# Patient Record
Sex: Female | Born: 1990 | Race: Black or African American | Hispanic: No | Marital: Single | Smoking: Current some day smoker
Health system: Southern US, Community
[De-identification: ages and names within clinical notes are randomized; demographics above are authoritative.]

## PROBLEM LIST (undated history)

## (undated) DIAGNOSIS — J45909 Unspecified asthma, uncomplicated: Secondary | ICD-10-CM

## (undated) DIAGNOSIS — E119 Type 2 diabetes mellitus without complications: Secondary | ICD-10-CM

## (undated) DIAGNOSIS — E282 Polycystic ovarian syndrome: Secondary | ICD-10-CM

## (undated) DIAGNOSIS — I10 Essential (primary) hypertension: Secondary | ICD-10-CM

## (undated) HISTORY — PX: CHOLECYSTECTOMY: SHX55

---

## 2014-02-03 ENCOUNTER — Encounter (HOSPITAL_COMMUNITY): Payer: Self-pay | Admitting: Emergency Medicine

## 2014-02-03 ENCOUNTER — Emergency Department (HOSPITAL_COMMUNITY)
Admission: EM | Admit: 2014-02-03 | Discharge: 2014-02-03 | Disposition: A | Discharge status: Home / Self Care | Payer: Medicaid - Out of State | Attending: Emergency Medicine | Admitting: Emergency Medicine

## 2014-02-03 DIAGNOSIS — R51 Headache: Secondary | ICD-10-CM | POA: Diagnosis not present

## 2014-02-03 DIAGNOSIS — Z3202 Encounter for pregnancy test, result negative: Secondary | ICD-10-CM | POA: Diagnosis not present

## 2014-02-03 DIAGNOSIS — R109 Unspecified abdominal pain: Secondary | ICD-10-CM | POA: Diagnosis not present

## 2014-02-03 DIAGNOSIS — R112 Nausea with vomiting, unspecified: Secondary | ICD-10-CM | POA: Insufficient documentation

## 2014-02-03 DIAGNOSIS — R197 Diarrhea, unspecified: Secondary | ICD-10-CM | POA: Insufficient documentation

## 2014-02-03 DIAGNOSIS — I10 Essential (primary) hypertension: Secondary | ICD-10-CM | POA: Insufficient documentation

## 2014-02-03 DIAGNOSIS — J45909 Unspecified asthma, uncomplicated: Secondary | ICD-10-CM | POA: Diagnosis not present

## 2014-02-03 DIAGNOSIS — E119 Type 2 diabetes mellitus without complications: Secondary | ICD-10-CM | POA: Insufficient documentation

## 2014-02-03 DIAGNOSIS — Z9089 Acquired absence of other organs: Secondary | ICD-10-CM | POA: Insufficient documentation

## 2014-02-03 DIAGNOSIS — R1110 Vomiting, unspecified: Secondary | ICD-10-CM

## 2014-02-03 HISTORY — DX: Unspecified asthma, uncomplicated: J45.909

## 2014-02-03 HISTORY — DX: Essential (primary) hypertension: I10

## 2014-02-03 HISTORY — DX: Type 2 diabetes mellitus without complications: E11.9

## 2014-02-03 LAB — COMPREHENSIVE METABOLIC PANEL
ALT: 15 U/L (ref 0–35)
AST: 17 U/L (ref 0–37)
Albumin: 3.7 g/dL (ref 3.5–5.2)
Alkaline Phosphatase: 123 U/L — ABNORMAL HIGH (ref 39–117)
Anion gap: 11 (ref 5–15)
BUN: 12 mg/dL (ref 6–23)
CHLORIDE: 101 meq/L (ref 96–112)
CO2: 26 meq/L (ref 19–32)
CREATININE: 0.55 mg/dL (ref 0.50–1.10)
Calcium: 9.4 mg/dL (ref 8.4–10.5)
GLUCOSE: 134 mg/dL — AB (ref 70–99)
Potassium: 3.8 mEq/L (ref 3.7–5.3)
Sodium: 138 mEq/L (ref 137–147)
Total Protein: 7.3 g/dL (ref 6.0–8.3)

## 2014-02-03 LAB — URINALYSIS, ROUTINE W REFLEX MICROSCOPIC
BILIRUBIN URINE: NEGATIVE
GLUCOSE, UA: NEGATIVE mg/dL
HGB URINE DIPSTICK: NEGATIVE
KETONES UR: NEGATIVE mg/dL
Leukocytes, UA: NEGATIVE
Nitrite: NEGATIVE
Protein, ur: NEGATIVE mg/dL
Specific Gravity, Urine: 1.026 (ref 1.005–1.030)
Urobilinogen, UA: 1 mg/dL (ref 0.0–1.0)
pH: 5.5 (ref 5.0–8.0)

## 2014-02-03 LAB — CBC WITH DIFFERENTIAL/PLATELET
BASOS ABS: 0 10*3/uL (ref 0.0–0.1)
Basophils Relative: 1 % (ref 0–1)
Eosinophils Absolute: 0.1 10*3/uL (ref 0.0–0.7)
Eosinophils Relative: 3 % (ref 0–5)
HCT: 37.3 % (ref 36.0–46.0)
Hemoglobin: 12.2 g/dL (ref 12.0–15.0)
LYMPHS ABS: 2.6 10*3/uL (ref 0.7–4.0)
LYMPHS PCT: 49 % — AB (ref 12–46)
MCH: 26.6 pg (ref 26.0–34.0)
MCHC: 32.7 g/dL (ref 30.0–36.0)
MCV: 81.4 fL (ref 78.0–100.0)
MONO ABS: 0.4 10*3/uL (ref 0.1–1.0)
Monocytes Relative: 8 % (ref 3–12)
NEUTROS ABS: 2 10*3/uL (ref 1.7–7.7)
Neutrophils Relative %: 39 % — ABNORMAL LOW (ref 43–77)
Platelets: 395 10*3/uL (ref 150–400)
RBC: 4.58 MIL/uL (ref 3.87–5.11)
RDW: 13.4 % (ref 11.5–15.5)
WBC: 5.2 10*3/uL (ref 4.0–10.5)

## 2014-02-03 LAB — PREGNANCY, URINE: Preg Test, Ur: NEGATIVE

## 2014-02-03 MED ORDER — LOPERAMIDE HCL 2 MG PO CAPS
4.0000 mg | ORAL_CAPSULE | Freq: Once | ORAL | Status: AC
  Administered 2014-02-03: 4 mg via ORAL
  Filled 2014-02-03: qty 2

## 2014-02-03 MED ORDER — KETOROLAC TROMETHAMINE 30 MG/ML IJ SOLN
30.0000 mg | Freq: Once | INTRAMUSCULAR | Status: AC
Start: 2014-02-03 — End: 2014-02-03
  Administered 2014-02-03: 30 mg via INTRAVENOUS
  Filled 2014-02-03: qty 1

## 2014-02-03 MED ORDER — LOPERAMIDE HCL 2 MG PO CAPS: 2.0000 mg | ORAL_CAPSULE | Freq: Four times a day (QID) | ORAL | Status: DC | PRN

## 2014-02-03 MED ORDER — ONDANSETRON 4 MG PO TBDP
4.0000 mg | ORAL_TABLET | Freq: Once | ORAL | Status: AC
Start: 2014-02-03 — End: 2014-02-03
  Administered 2014-02-03: 4 mg via ORAL
  Filled 2014-02-03: qty 1

## 2014-02-03 MED ORDER — OXYCODONE-ACETAMINOPHEN 5-325 MG PO TABS
1.0000 | ORAL_TABLET | Freq: Once | ORAL | Status: AC
  Administered 2014-02-03: 1 via ORAL
  Filled 2014-02-03: qty 1

## 2014-02-03 MED ORDER — ONDANSETRON HCL 4 MG PO TABS: 4.0000 mg | ORAL_TABLET | Freq: Three times a day (TID) | ORAL | Status: DC | PRN

## 2014-02-03 NOTE — ED Notes (Signed)
Pt. reports nausea,vomitting , diarrhea and low abdominal pain onset 2 days ago , denies fever or chills.

## 2014-02-03 NOTE — Discharge Instructions (Signed)

## 2014-02-03 NOTE — ED Provider Notes (Signed)
CSN: [id number]     Arrival date & time 02/03/14  [id number] History   First MD Initiated Contact with Patient 02/03/14 [phone]     Chief Complaint  Patient presents with  . Emesis     (Consider location/radiation/quality/duration/timing/severity/associated sxs/prior Treatment) HPI  This a 23 year old female who presents with nausea, vomiting, diarrhea, and abdominal pain. Patient reports 2 days of symptoms. She reports crampy midline abdominal pain that is nonradiating. Currently it is 8 out of 10. It comes and goes. She is had associated nonbilious, non-bloody emesis and diarrhea. No sick contacts. Denies any fevers or dysuria. Patient also reports that she has had a headache. She has history of migraines and took Excedrin prior to arrival. Denies any vaginal discharge.  Past Medical History  Diagnosis Date  . Diabetes mellitus without complication   . Hypertension   . Asthma    Past Surgical History  Procedure Laterality Date  . Cholecystectomy     No family history on file. History  Substance Use Topics  . Smoking status: Never Smoker   . Smokeless tobacco: Not on file  . Alcohol Use: Yes   OB History   Grav Para Term Preterm Abortions TAB SAB Ect Mult Living                 Review of Systems  Constitutional: Negative for fever.  Respiratory: Negative for cough, chest tightness and shortness of breath.   Cardiovascular: Negative for chest pain.  Gastrointestinal: Positive for nausea, vomiting, abdominal pain and diarrhea.  Genitourinary: Negative for dysuria and flank pain.  Musculoskeletal: Negative for back pain.  Skin: Negative for wound.  Neurological: Positive for headaches. Negative for weakness and numbness.  Psychiatric/Behavioral: Negative for confusion.  All other systems reviewed and are negative.     Allergies  Review of patient's allergies indicates no known allergies.  Home Medications   Prior to Admission medications   Medication Sig Start Date End  Date Taking? Authorizing Provider  loperamide (IMODIUM) 2 MG capsule Take 1 capsule (2 mg total) by mouth 4 (four) times daily as needed for diarrhea or loose stools. 02/03/14   Shon Batonourtney F Darnelle Derrick, MD  ondansetron (ZOFRAN) 4 MG tablet Take 1 tablet (4 mg total) by mouth every 8 (eight) hours as needed for nausea or vomiting. 02/03/14   Shon Batonourtney F Angele Wiemann, MD   BP 125/75  Pulse 65  Temp(Src) 98.1 F (36.7 C) (Oral)  Resp 16  Ht 5\' 5"  (1.651 m)  Wt 181 lb (82.101 kg)  BMI 30.12 kg/m2  SpO2 98%  LMP 12/19/2013 Physical Exam  Nursing note and vitals reviewed. Constitutional: She is oriented to person, place, and time. She appears well-developed and well-nourished. No distress.  HENT:  Head: Normocephalic and atraumatic.  Cardiovascular: Normal rate, regular rhythm and normal heart sounds.   No murmur heard. Pulmonary/Chest: Effort normal and breath sounds normal. No respiratory distress. She has no wheezes.  Abdominal: Soft. Bowel sounds are normal. There is no tenderness. There is no rebound and no guarding.  Neurological: She is alert and oriented to person, place, and time.  Skin: Skin is warm and dry.  Psychiatric: She has a normal mood and affect.    ED Course  Procedures (including critical care time) Labs Review Labs Reviewed  CBC WITH DIFFERENTIAL - Abnormal; Notable for the following:    Neutrophils Relative % 39 (*)    Lymphocytes Relative 49 (*)    All other components within normal limits  COMPREHENSIVE METABOLIC PANEL -  Abnormal; Notable for the following:    Glucose, Bld 134 (*)    Alkaline Phosphatase 123 (*)    Total Bilirubin <0.2 (*)    All other components within normal limits  URINALYSIS, ROUTINE W REFLEX MICROSCOPIC - Abnormal; Notable for the following:    APPearance CLOUDY (*)    All other components within normal limits  PREGNANCY, URINE    Imaging Review No results found.   EKG Interpretation None      MDM   Final diagnoses:  Abdominal  pain, vomiting, and diarrhea    Patient presents with nausea, vomiting, and diarrhea. Also reports abdominal pain. Vital signs stable and exam is benign. Lab work reviewed and is largely reassuring. No evidence of urinary tract infection. Patient given pain and nausea medication as well as Imodium. Reports improvement of her symptoms. Suspect viral etiology of symptoms. Discussed with patient supportive care at home with Zofran and Imodium. Patient stated understanding.  After history, exam, and medical workup I feel the patient has been appropriately medically screened and is safe for discharge home. Pertinent diagnoses were discussed with the patient. Patient was given return precautions.     Shon Batonourtney F Devika Dragovich, MD 02/03/14 [phone]

## 2014-02-19 ENCOUNTER — Encounter (HOSPITAL_COMMUNITY): Payer: Self-pay | Admitting: Emergency Medicine

## 2014-02-19 ENCOUNTER — Emergency Department (HOSPITAL_COMMUNITY): Payer: Medicaid - Out of State

## 2014-02-19 ENCOUNTER — Emergency Department (HOSPITAL_COMMUNITY)
Admission: EM | Admit: 2014-02-19 | Discharge: 2014-02-19 | Disposition: A | Discharge status: Home / Self Care | Payer: Medicaid - Out of State | Attending: Emergency Medicine | Admitting: Emergency Medicine

## 2014-02-19 DIAGNOSIS — Y93B9 Activity, other involving muscle strengthening exercises: Secondary | ICD-10-CM | POA: Insufficient documentation

## 2014-02-19 DIAGNOSIS — E119 Type 2 diabetes mellitus without complications: Secondary | ICD-10-CM | POA: Insufficient documentation

## 2014-02-19 DIAGNOSIS — S199XXA Unspecified injury of neck, initial encounter: Secondary | ICD-10-CM | POA: Diagnosis present

## 2014-02-19 DIAGNOSIS — Y9289 Other specified places as the place of occurrence of the external cause: Secondary | ICD-10-CM | POA: Diagnosis not present

## 2014-02-19 DIAGNOSIS — Y998 Other external cause status: Secondary | ICD-10-CM | POA: Insufficient documentation

## 2014-02-19 DIAGNOSIS — J45909 Unspecified asthma, uncomplicated: Secondary | ICD-10-CM | POA: Diagnosis not present

## 2014-02-19 DIAGNOSIS — S161XXA Strain of muscle, fascia and tendon at neck level, initial encounter: Secondary | ICD-10-CM | POA: Diagnosis not present

## 2014-02-19 DIAGNOSIS — X58XXXA Exposure to other specified factors, initial encounter: Secondary | ICD-10-CM | POA: Diagnosis not present

## 2014-02-19 DIAGNOSIS — I10 Essential (primary) hypertension: Secondary | ICD-10-CM | POA: Insufficient documentation

## 2014-02-19 DIAGNOSIS — M542 Cervicalgia: Secondary | ICD-10-CM

## 2014-02-19 MED ORDER — KETOROLAC TROMETHAMINE 60 MG/2ML IM SOLN
60.0000 mg | Freq: Once | INTRAMUSCULAR | Status: AC
  Administered 2014-02-19: 60 mg via INTRAMUSCULAR
  Filled 2014-02-19: qty 2

## 2014-02-19 MED ORDER — CYCLOBENZAPRINE HCL 5 MG PO TABS: 5.0000 mg | ORAL_TABLET | Freq: Three times a day (TID) | ORAL | Status: DC | PRN

## 2014-02-19 MED ORDER — CYCLOBENZAPRINE HCL 10 MG PO TABS
5.0000 mg | ORAL_TABLET | Freq: Once | ORAL | Status: AC
  Administered 2014-02-19: 5 mg via ORAL
  Filled 2014-02-19: qty 1

## 2014-02-19 NOTE — ED Provider Notes (Signed)
CSN: [id number]     Arrival date & time 02/19/14  0908 History   First MD Initiated Contact with Patient 02/19/14 [id number]     Chief Complaint  Patient presents with  . Neck Pain      HPI Comments: [name] presents for evaluation of left neck pain.  4 days ago she was doing a new exercise routine and the next morning she awoke with left-sided neck pain. The exercises were mostly stretching in nature and she had no pain during activity. She reports her pain is left occipital and going down the left shoulder and upper back. Pain is moderate in nature and worse with range of motion and activity. She has tried Tylenol and ibuprofen without any improvement in her pain. She does report some nausea but no vomiting. No new numbness or weakness. She does have difficulty raising her left arm because of pain in the neck and posterior shoulder.her pain is worse with rotation of the neck to the left  Patient is a 23 y.o. female presenting with neck pain. The history is provided by the patient.  Neck Pain Pain location:  L side Quality:  Stiffness Stiffness is present:  All day Pain radiates to:  Does not radiate Pain severity:  Moderate Onset quality:  Gradual Duration:  4 days Timing:  Constant Progression:  Worsening Chronicity:  New Ineffective treatments:  Analgesics Associated symptoms: no fever, no numbness, no paresis and no tingling     Past Medical History  Diagnosis Date  . Diabetes mellitus without complication   . Hypertension   . Asthma    Past Surgical History  Procedure Laterality Date  . Cholecystectomy     History reviewed. No pertinent family history. History  Substance Use Topics  . Smoking status: Never Smoker   . Smokeless tobacco: Not on file  . Alcohol Use: Yes   OB History    No data available     Review of Systems  Constitutional: Negative for fever.  Musculoskeletal: Positive for neck pain.  Neurological: Negative for tingling and numbness.  All other  systems reviewed and are negative.     Allergies  Review of patient's allergies indicates no known allergies.  Home Medications   Prior to Admission medications   Medication Sig Start Date End Date Taking? Authorizing Provider  loperamide (IMODIUM) 2 MG capsule Take 1 capsule (2 mg total) by mouth 4 (four) times daily as needed for diarrhea or loose stools. 02/03/14   Shon Batonourtney F Horton, MD  ondansetron (ZOFRAN) 4 MG tablet Take 1 tablet (4 mg total) by mouth every 8 (eight) hours as needed for nausea or vomiting. 02/03/14   Shon Batonourtney F Horton, MD   BP 132/86 mmHg  Pulse 91  Temp(Src) 97.8 F (36.6 C) (Oral)  Resp 20  Ht 5\' 5"  (1.651 m)  Wt 184 lb 4.8 oz (83.598 kg)  BMI 30.67 kg/m2  SpO2 100%  LMP 12/19/2013 Physical Exam  Constitutional: She is oriented to person, place, and time. She appears well-developed and well-nourished.  HENT:  Head: Normocephalic and atraumatic.  Eyes: EOM are normal. Pupils are equal, round, and reactive to light.  Neck:  Tender to palpation over left posterior lateral neck and upper back with palpable muscle spasm. No carotid bruits. No cervical lymphadenopathy.  Patient cannot comfortably range neck. Position of comfort is looking straight forward.   Cardiovascular: Normal rate and regular rhythm.   No murmur heard. Pulmonary/Chest: Effort normal and breath sounds normal. No respiratory distress.  Abdominal: Soft. There is no tenderness. There is no rebound and no guarding.  Musculoskeletal: She exhibits no edema or tenderness.  Neurological: She is alert and oriented to person, place, and time.  5 out of 5 strength in bilateral upper and lower extremities. Sensation to light touch intact in all 4 extremities.  Skin: Skin is warm and dry.  Psychiatric: She has a normal mood and affect. Her behavior is normal.  Nursing note and vitals reviewed.   ED Course  Procedures (including critical care time) Labs Review Labs Reviewed - No data to  display  Imaging Review Dg Cervical Spine 2-3 Views  02/19/2014   CLINICAL DATA:  Left neck strain, pain  EXAM: CERVICAL SPINE  4+ VIEWS  COMPARISON:  None.  FINDINGS: There is no evidence of cervical spine fracture or prevertebral soft tissue swelling. Alignment is normal. No other significant bone abnormalities are identified.  IMPRESSION: Negative cervical spine radiographs.   Electronically Signed   By: Ruel Favorsrevor  Shick M.D.   On: 02/19/2014 10:49     EKG Interpretation None      MDM   Final diagnoses:  Neck pain on left side  Cervical strain, acute, initial encounter    Patient presents for evaluation of neck pain following exercise. Patient with cervical strain with muscle spasm on exam. Clinical picture was not consistent with dissection, cervical spine injury, or herniated disc. Discussed with patient home care for cervical strain as well as importance of taking over-the-counter medicines as directed and need for PCP follow-up. Return precautions discussed.    Tilden FossaElizabeth Oluwafemi Villella, MD 02/19/14 1101

## 2014-02-19 NOTE — ED Notes (Signed)
Left lateral neck pain and stiffness x3 days. Working out when injury occurred. Muscular tenderness and stiffness on Left neck and upper shoulder. ambulatory

## 2014-02-19 NOTE — Discharge Instructions (Signed)

## 2014-02-19 NOTE — ED Notes (Signed)
Patient transported to X-ray 

## 2014-03-25 ENCOUNTER — Emergency Department (HOSPITAL_COMMUNITY)
Admission: EM | Admit: 2014-03-25 | Discharge: 2014-03-25 | Disposition: A | Discharge status: Home / Self Care | Payer: Medicaid - Out of State | Attending: Emergency Medicine | Admitting: Emergency Medicine

## 2014-03-25 ENCOUNTER — Encounter (HOSPITAL_COMMUNITY): Payer: Self-pay | Admitting: Emergency Medicine

## 2014-03-25 DIAGNOSIS — E119 Type 2 diabetes mellitus without complications: Secondary | ICD-10-CM | POA: Insufficient documentation

## 2014-03-25 DIAGNOSIS — Z3202 Encounter for pregnancy test, result negative: Secondary | ICD-10-CM | POA: Insufficient documentation

## 2014-03-25 DIAGNOSIS — J45909 Unspecified asthma, uncomplicated: Secondary | ICD-10-CM | POA: Insufficient documentation

## 2014-03-25 DIAGNOSIS — R42 Dizziness and giddiness: Secondary | ICD-10-CM

## 2014-03-25 DIAGNOSIS — R1084 Generalized abdominal pain: Secondary | ICD-10-CM | POA: Diagnosis not present

## 2014-03-25 DIAGNOSIS — I10 Essential (primary) hypertension: Secondary | ICD-10-CM | POA: Diagnosis not present

## 2014-03-25 DIAGNOSIS — R109 Unspecified abdominal pain: Secondary | ICD-10-CM

## 2014-03-25 LAB — CBC WITH DIFFERENTIAL/PLATELET
Basophils Absolute: 0 10*3/uL (ref 0.0–0.1)
Basophils Relative: 0 % (ref 0–1)
EOS ABS: 0.1 10*3/uL (ref 0.0–0.7)
Eosinophils Relative: 2 % (ref 0–5)
HCT: 36.9 % (ref 36.0–46.0)
HEMOGLOBIN: 12.1 g/dL (ref 12.0–15.0)
Lymphocytes Relative: 48 % — ABNORMAL HIGH (ref 12–46)
Lymphs Abs: 2.8 10*3/uL (ref 0.7–4.0)
MCH: 26.7 pg (ref 26.0–34.0)
MCHC: 32.8 g/dL (ref 30.0–36.0)
MCV: 81.3 fL (ref 78.0–100.0)
MONOS PCT: 6 % (ref 3–12)
Monocytes Absolute: 0.3 10*3/uL (ref 0.1–1.0)
NEUTROS ABS: 2.6 10*3/uL (ref 1.7–7.7)
Neutrophils Relative %: 44 % (ref 43–77)
Platelets: 348 10*3/uL (ref 150–400)
RBC: 4.54 MIL/uL (ref 3.87–5.11)
RDW: 13.5 % (ref 11.5–15.5)
WBC: 5.8 10*3/uL (ref 4.0–10.5)

## 2014-03-25 LAB — URINALYSIS, ROUTINE W REFLEX MICROSCOPIC
Bilirubin Urine: NEGATIVE
Glucose, UA: NEGATIVE mg/dL
Hgb urine dipstick: NEGATIVE
Ketones, ur: NEGATIVE mg/dL
LEUKOCYTES UA: NEGATIVE
NITRITE: NEGATIVE
PH: 5.5 (ref 5.0–8.0)
PROTEIN: NEGATIVE mg/dL
Specific Gravity, Urine: 1.018 (ref 1.005–1.030)
Urobilinogen, UA: 0.2 mg/dL (ref 0.0–1.0)

## 2014-03-25 LAB — COMPREHENSIVE METABOLIC PANEL
ALBUMIN: 3.9 g/dL (ref 3.5–5.2)
ALT: 15 U/L (ref 0–35)
ANION GAP: 11 (ref 5–15)
AST: 17 U/L (ref 0–37)
Alkaline Phosphatase: 127 U/L — ABNORMAL HIGH (ref 39–117)
BUN: 12 mg/dL (ref 6–23)
CHLORIDE: 101 meq/L (ref 96–112)
CO2: 27 mEq/L (ref 19–32)
Calcium: 9.8 mg/dL (ref 8.4–10.5)
Creatinine, Ser: 0.55 mg/dL (ref 0.50–1.10)
GFR calc Af Amer: >90 mL/min (ref >90)
GFR calc non Af Amer: >90 mL/min (ref >90)
Glucose, Bld: 91 mg/dL (ref 70–99)
Potassium: 3.7 mEq/L (ref 3.7–5.3)
Sodium: 139 mEq/L (ref 137–147)
Total Protein: 7.5 g/dL (ref 6.0–8.3)

## 2014-03-25 LAB — POC URINE PREG, ED: Preg Test, Ur: NEGATIVE

## 2014-03-25 LAB — CBG MONITORING, ED: Glucose-Capillary: 81 mg/dL (ref 70–99)

## 2014-03-25 MED ORDER — SODIUM CHLORIDE 0.9 % IV BOLUS (SEPSIS)
1000.0000 mL | Freq: Once | INTRAVENOUS | Status: AC
  Administered 2014-03-25: 1000 mL via INTRAVENOUS

## 2014-03-25 NOTE — ED Notes (Signed)
Patient does not want to undress.

## 2014-03-25 NOTE — ED Provider Notes (Signed)
CSN: [id number]     Arrival date & time 03/25/14  0013 History  This chart was scribed for No att. providers found by Abel PrestoKara Demonbreun, ED Scribe. This patient was seen in room A11C/A11C and the patient's care was started at 12:54 AM.    Chief Complaint  Patient presents with  . Pelvic Pain  . Dizziness  . Possible Pregnancy    Patient is a 23 y.o. female presenting with pelvic pain, dizziness, and pregnancy problem. The history is provided by the patient. No language interpreter was used.  Pelvic Pain Associated symptoms include abdominal pain. Pertinent negatives include no headaches and no shortness of breath.  Dizziness Associated symptoms: no diarrhea, no headaches, no nausea, no shortness of breath and no vomiting   Possible Pregnancy Associated symptoms include abdominal pain. Pertinent negatives include no headaches and no shortness of breath.    HPI Comments: Brittany Dalton is a 23 y.o. female who presents to the Emergency Department complaining of intermittent dizziness with onset 2 weeks ago.  She states she feels light-headed when she stands quickly. Pt states she is having intermittent abdominal and pelvic cramping. Pt states she is often thirsty and notes associated frequency in urination. Pt started bcp 7 months ago but ran out in September and has not taken any since. She notes her LMP was in September. Pt denies vaginal bleeding and dysuria. Pt is from [place] and has no OB/GYN here. No fever or chills. Currently abdominal pain is improved. No current dizziness.  Past Medical History  Diagnosis Date  . Diabetes mellitus without complication   . Hypertension   . Asthma    Past Surgical History  Procedure Laterality Date  . Cholecystectomy     History reviewed. No pertinent family history. History  Substance Use Topics  . Smoking status: Never Smoker   . Smokeless tobacco: Not on file  . Alcohol Use: Yes   OB History    No data available     Review of Systems   Constitutional: Negative for fever and chills.  Respiratory: Negative for shortness of breath and wheezing.   Gastrointestinal: Positive for abdominal pain. Negative for nausea, vomiting, diarrhea and constipation.  Endocrine: Positive for polydipsia.  Genitourinary: Positive for frequency and pelvic pain. Negative for dysuria, vaginal bleeding and vaginal discharge (light).  Musculoskeletal: Negative for back pain, neck pain and neck stiffness.  Neurological: Positive for dizziness and light-headedness. Negative for weakness, numbness and headaches.  All other systems reviewed and are negative.     Allergies  Review of patient's allergies indicates no known allergies.  Home Medications   Prior to Admission medications   Medication Sig Start Date End Date Taking? Authorizing Provider  acetaminophen (TYLENOL) 500 MG tablet Take 1,000 mg by mouth every 6 (six) hours as needed (pain).    Historical Provider, MD  cyclobenzaprine (FLEXERIL) 5 MG tablet Take 1 tablet (5 mg total) by mouth 3 (three) times daily as needed for muscle spasms. Patient not taking: Reported on 03/25/2014 02/19/14   Tilden FossaElizabeth Rees, MD  loperamide (IMODIUM) 2 MG capsule Take 1 capsule (2 mg total) by mouth 4 (four) times daily as needed for diarrhea or loose stools. Patient not taking: Reported on 02/19/2014 02/03/14   Shon Batonourtney F Horton, MD  ondansetron (ZOFRAN) 4 MG tablet Take 1 tablet (4 mg total) by mouth every 8 (eight) hours as needed for nausea or vomiting. Patient not taking: Reported on 02/19/2014 02/03/14   Shon Batonourtney F Horton, MD   BP 118/73  mmHg  Pulse 77  Temp(Src) 97.6 F (36.4 C) (Oral)  Resp 21  Ht 5' 5.75" (1.67 m)  Wt 184 lb (83.462 kg)  BMI 29.93 kg/m2  SpO2 100%  LMP 01/05/2014 (Approximate) Physical Exam  Constitutional: She is oriented to person, place, and time. She appears well-developed and well-nourished. No distress.  HENT:  Head: Normocephalic and atraumatic.  Mouth/Throat:  Oropharynx is clear and moist.  Eyes: Conjunctivae and EOM are normal. Pupils are equal, round, and reactive to light.  Neck: Normal range of motion. Neck supple.  Cardiovascular: Normal rate and regular rhythm.   Pulmonary/Chest: Effort normal and breath sounds normal. No respiratory distress. She has no wheezes. She has no rales.  Abdominal: Soft. Bowel sounds are normal. She exhibits no distension and no mass. There is no tenderness. There is no rebound and no guarding.  Musculoskeletal: Normal range of motion. She exhibits no edema or tenderness.  No CVA tenderness bilaterally.  Neurological: She is alert and oriented to person, place, and time.  Patient is alert and oriented x3 with clear, goal oriented speech. Patient has 5/5 motor in all extremities. Sensation is intact to light touch. Patient has a normal gait and walks without assistance.  Skin: Skin is warm and dry. No rash noted. No erythema.  Psychiatric: She has a normal mood and affect. Her behavior is normal.  Nursing note and vitals reviewed.   ED Course  Procedures (including critical care time) DIAGNOSTIC STUDIES: Oxygen Saturation is 100% on room air, normal by my interpretation.    COORDINATION OF CARE: 1:00 AM Discussed treatment plan with patient at beside, the patient agrees with the plan and has no further questions at this time.   Labs Review Labs Reviewed  CBC WITH DIFFERENTIAL - Abnormal; Notable for the following:    Lymphocytes Relative 48 (*)    All other components within normal limits  COMPREHENSIVE METABOLIC PANEL - Abnormal; Notable for the following:    Alkaline Phosphatase 127 (*)    Total Bilirubin <0.2 (*)    All other components within normal limits  URINALYSIS, ROUTINE W REFLEX MICROSCOPIC - Abnormal; Notable for the following:    APPearance HAZY (*)    All other components within normal limits  POC URINE PREG, ED  CBG MONITORING, ED    Imaging Review No results found.   EKG  Interpretation None      MDM   Final diagnoses:  Abdominal cramping  Episodic lightheadedness    I personally performed the services described in this documentation, which was scribed in my presence. The recorded information has been reviewed and is accurate.  Patient's abdomen is soft and nontender. Normal laboratory workup. Feeling better after IV fluids. Advised to drink plenty of water. Return precautions given.    Loren Raceravid Jo Cerone, MD 03/25/14 [phone]

## 2014-03-25 NOTE — Discharge Instructions (Signed)
°Abdominal Pain, Women °Abdominal (stomach, pelvic, or belly) pain can be caused by many things. It is important to tell your doctor: °· The location of the pain. °· Does it come and go or is it present all the time? °· Are there things that start the pain (eating certain foods, exercise)? °· Are there other symptoms associated with the pain (fever, nausea, vomiting, diarrhea)? °All of this is helpful to know when trying to find the cause of the pain. °CAUSES  °· Stomach: virus or bacteria infection, or ulcer. °· Intestine: appendicitis (inflamed appendix), regional ileitis (Crohn's disease), ulcerative colitis (inflamed colon), irritable bowel syndrome, diverticulitis (inflamed diverticulum of the colon), or cancer of the stomach or intestine. °· Gallbladder disease or stones in the gallbladder. °· Kidney disease, kidney stones, or infection. °· Pancreas infection or cancer. °· Fibromyalgia (pain disorder). °· Diseases of the female organs: °¨ Uterus: fibroid (non-cancerous) tumors or infection. °¨ Fallopian tubes: infection or tubal pregnancy. °¨ Ovary: cysts or tumors. °¨ Pelvic adhesions (scar tissue). °¨ Endometriosis (uterus lining tissue growing in the pelvis and on the pelvic organs). °¨ Pelvic congestion syndrome (female organs filling up with blood just before the menstrual period). °¨ Pain with the menstrual period. °¨ Pain with ovulation (producing an egg). °¨ Pain with an IUD (intrauterine device, birth control) in the uterus. °¨ Cancer of the female organs. °· Functional pain (pain not caused by a disease, may improve without treatment). °· Psychological pain. °· Depression. °DIAGNOSIS  °Your doctor will decide the seriousness of your pain by doing an examination. °· Blood tests. °· X-rays. °· Ultrasound. °· CT scan (computed tomography, special type of X-ray). °· MRI (magnetic resonance imaging). °· Cultures, for infection. °· Barium enema (dye inserted in the large intestine, to better view it with  X-rays). °· Colonoscopy (looking in intestine with a lighted tube). °· Laparoscopy (minor surgery, looking in abdomen with a lighted tube). °· Major abdominal exploratory surgery (looking in abdomen with a large incision). °TREATMENT  °The treatment will depend on the cause of the pain.  °· Many cases can be observed and treated at home. °· Over-the-counter medicines recommended by your caregiver. °· Prescription medicine. °· Antibiotics, for infection. °· Birth control pills, for painful periods or for ovulation pain. °· Hormone treatment, for endometriosis. °· Nerve blocking injections. °· Physical therapy. °· Antidepressants. °· Counseling with a psychologist or psychiatrist. °· Minor or major surgery. °HOME CARE INSTRUCTIONS  °· Do not take laxatives, unless directed by your caregiver. °· Take over-the-counter pain medicine only if ordered by your caregiver. Do not take aspirin because it can cause an upset stomach or bleeding. °· Try a clear liquid diet (broth or water) as ordered by your caregiver. Slowly move to a bland diet, as tolerated, if the pain is related to the stomach or intestine. °· Have a thermometer and take your temperature several times a day, and record it. °· Bed rest and sleep, if it helps the pain. °· Avoid sexual intercourse, if it causes pain. °· Avoid stressful situations. °· Keep your follow-up appointments and tests, as your caregiver orders. °· If the pain does not go away with medicine or surgery, you may try: °¨ Acupuncture. °¨ Relaxation exercises (yoga, meditation). °¨ Group therapy. °¨ Counseling. °SEEK MEDICAL CARE IF:  °· You notice certain foods cause stomach pain. °· Your home care treatment is not helping your pain. °· You need stronger pain medicine. °· You want your IUD removed. °· You feel faint or   lightheaded. °· You develop nausea and vomiting. °· You develop a rash. °· You are having side effects or an allergy to your medicine. °SEEK IMMEDIATE MEDICAL CARE IF:  °· Your  pain does not go away or gets worse. °· You have a fever. °· Your pain is felt only in portions of the abdomen. The right side could possibly be appendicitis. The left lower portion of the abdomen could be colitis or diverticulitis. °· You are passing blood in your stools (bright red or black tarry stools, with or without vomiting). °· You have blood in your urine. °· You develop chills, with or without a fever. °· You pass out. °MAKE SURE YOU:  °· Understand these instructions. °· Will watch your condition. °· Will get help right away if you are not doing well or get worse. °Document Released: 01/20/2007 Document Revised: 08/09/2013 Document Reviewed: 02/09/2009 °Exit[employer]® Patient Information ©2015 [employer]. This information is not intended to replace advice given to you by your health care provider. Make sure you discuss any questions you have with your health care provider. ° ° °Dizziness °Dizziness is a common problem. It is a feeling of unsteadiness or light-headedness. You may feel like you are about to faint. Dizziness can lead to injury if you stumble or fall. A person of any age group can suffer from dizziness, but dizziness is more common in older adults. °CAUSES  °Dizziness can be caused by many different things, including: °· Middle ear problems. °· Standing for too long. °· Infections. °· An allergic reaction. °· Aging. °· An emotional response to something, such as the sight of blood. °· Side effects of medicines. °· Tiredness. °· Problems with circulation or blood pressure. °· Excessive use of alcohol or medicines, or illegal drug use. °· Breathing too fast (hyperventilation). °· An irregular heart rhythm (arrhythmia). °· A low red blood cell count (anemia). °· Pregnancy. °· Vomiting, diarrhea, fever, or other illnesses that cause body fluid loss (dehydration). °· Diseases or conditions such as Parkinson's disease, high blood pressure (hypertension), diabetes, and thyroid problems. °· Exposure to  extreme heat. °DIAGNOSIS  °Your health care provider will ask about your symptoms, perform a physical exam, and perform an electrocardiogram (ECG) to record the electrical activity of your heart. Your health care provider may also perform other heart or blood tests to determine the cause of your dizziness. These may include: °· Transthoracic echocardiogram (TTE). During echocardiography, sound waves are used to evaluate how blood flows through your heart. °· Transesophageal echocardiogram (TEE). °· Cardiac monitoring. This allows your health care provider to monitor your heart rate and rhythm in real time. °· Holter monitor. This is a portable device that records your heartbeat and can help diagnose heart arrhythmias. It allows your health care provider to track your heart activity for several days if needed. °· Stress tests by exercise or by giving medicine that makes the heart beat faster. °TREATMENT  °Treatment of dizziness depends on the cause of your symptoms and can vary greatly. °HOME CARE INSTRUCTIONS  °· Drink enough fluids to keep your urine clear or pale yellow. This is especially important in very hot weather. In older adults, it is also important in cold weather. °· Take your medicine exactly as directed if your dizziness is caused by medicines. When taking blood pressure medicines, it is especially important to get up slowly. °· Rise slowly from chairs and steady yourself until you feel okay. °· In the morning, first sit up on the side of   the bed. When you feel okay, stand slowly while holding onto something until you know your balance is fine. °· Move your legs often if you need to stand in one place for a long time. Tighten and relax your muscles in your legs while standing. °· Have someone stay with you for 1-2 days if dizziness continues to be a problem. Do this until you feel you are well enough to stay alone. Have the person call your health care provider if he or she notices changes in you that  are concerning. °· Do not drive or use heavy machinery if you feel dizzy. °· Do not drink alcohol. °SEEK IMMEDIATE MEDICAL CARE IF:  °· Your dizziness or light-headedness gets worse. °· You feel nauseous or vomit. °· You have problems talking, walking, or using your arms, hands, or legs. °· You feel weak. °· You are not thinking clearly or you have trouble forming sentences. It may take a friend or family member to notice this. °· You have chest pain, abdominal pain, shortness of breath, or sweating. °· Your vision changes. °· You notice any bleeding. °· You have side effects from medicine that seems to be getting worse rather than better. °MAKE SURE YOU:  °· Understand these instructions. °· Will watch your condition. °· Will get help right away if you are not doing well or get worse. °Document Released: 09/18/2000 Document Revised: 03/30/2013 Document Reviewed: 10/12/2010 °ExitCare® Patient Information ©2015 [employer]. This information is not intended to replace advice given to you by your health care provider. Make sure you discuss any questions you have with your health care provider. ° °

## 2014-03-25 NOTE — ED Notes (Signed)
Pt. Refused wheelchair and left with all belongings 

## 2014-03-25 NOTE — ED Notes (Signed)
Pt ambulated well with a steady gait and good pace. Pt denied feeling dizzy or light headed.

## 2014-03-25 NOTE — ED Notes (Signed)
Patient here with pelvic pain, dizziness, headache, and possible pregnancy. States that she took a home pregnancy earlier today and it gave her a faint positive. Patient [place] in [place] and was assured that she couldn't be pregnant do to her PCOS. LMP reported as being in Sept.

## 2014-05-27 ENCOUNTER — Emergency Department (HOSPITAL_COMMUNITY): Payer: PRIVATE HEALTH INSURANCE

## 2014-05-27 ENCOUNTER — Encounter (HOSPITAL_COMMUNITY): Payer: Self-pay | Admitting: Emergency Medicine

## 2014-05-27 ENCOUNTER — Emergency Department (HOSPITAL_COMMUNITY)
Admission: EM | Admit: 2014-05-27 | Discharge: 2014-05-27 | Disposition: A | Discharge status: Home / Self Care | Payer: Self-pay | Attending: Emergency Medicine | Admitting: Emergency Medicine

## 2014-05-27 DIAGNOSIS — R05 Cough: Secondary | ICD-10-CM

## 2014-05-27 DIAGNOSIS — E119 Type 2 diabetes mellitus without complications: Secondary | ICD-10-CM | POA: Insufficient documentation

## 2014-05-27 DIAGNOSIS — I10 Essential (primary) hypertension: Secondary | ICD-10-CM | POA: Insufficient documentation

## 2014-05-27 DIAGNOSIS — Z79899 Other long term (current) drug therapy: Secondary | ICD-10-CM | POA: Insufficient documentation

## 2014-05-27 DIAGNOSIS — J069 Acute upper respiratory infection, unspecified: Secondary | ICD-10-CM | POA: Insufficient documentation

## 2014-05-27 DIAGNOSIS — R112 Nausea with vomiting, unspecified: Secondary | ICD-10-CM | POA: Insufficient documentation

## 2014-05-27 DIAGNOSIS — R059 Cough, unspecified: Secondary | ICD-10-CM

## 2014-05-27 DIAGNOSIS — J45909 Unspecified asthma, uncomplicated: Secondary | ICD-10-CM | POA: Insufficient documentation

## 2014-05-27 MED ORDER — HYDROCODONE-HOMATROPINE 5-1.5 MG/5ML PO SYRP: 5.0000 mL | ORAL_SOLUTION | Freq: Four times a day (QID) | ORAL | Status: DC | PRN

## 2014-05-27 MED ORDER — NAPROXEN 500 MG PO TABS: 500.0000 mg | ORAL_TABLET | Freq: Two times a day (BID) | ORAL | Status: DC

## 2014-05-27 MED ORDER — ALBUTEROL SULFATE HFA 108 (90 BASE) MCG/ACT IN AERS
2.0000 | INHALATION_SPRAY | Freq: Once | RESPIRATORY_TRACT | Status: AC
  Administered 2014-05-27: 2 via RESPIRATORY_TRACT
  Filled 2014-05-27: qty 6.7

## 2014-05-27 MED ORDER — NAPROXEN 250 MG PO TABS
500.0000 mg | ORAL_TABLET | Freq: Once | ORAL | Status: AC
  Administered 2014-05-27: 500 mg via ORAL
  Filled 2014-05-27: qty 2

## 2014-05-27 MED ORDER — AZITHROMYCIN 250 MG PO TABS: 250.0000 mg | ORAL_TABLET | Freq: Every day | ORAL | Status: DC

## 2014-05-27 MED ORDER — HYDROCODONE-HOMATROPINE 5-1.5 MG/5ML PO SYRP
5.0000 mL | ORAL_SOLUTION | Freq: Once | ORAL | Status: AC
  Administered 2014-05-27: 5 mL via ORAL
  Filled 2014-05-27: qty 5

## 2014-05-27 MED ORDER — BENZONATATE 100 MG PO CAPS: 100.0000 mg | ORAL_CAPSULE | Freq: Three times a day (TID) | ORAL | Status: DC

## 2014-05-27 NOTE — Discharge Instructions (Signed)
Recommend an albuterol inhaler, 2 puffs every 4 hours, as needed for cough and shortness of breath. Take azithromycin as prescribed. Also recommend Tessalon for cough. You may take Hycodan as needed, as prescribed, for cough as well. Do not drive or drink alcohol after taking this medication. Take naproxen for fever and body aches. Follow-up with your primary doctor for recheck of symptoms in 3 days.  Cough, Adult  A cough is a reflex that helps clear your throat and airways. It can help heal the body or may be a reaction to an irritated airway. A cough may only last 2 or 3 weeks (acute) or may last more than 8 weeks (chronic).  CAUSES Acute cough:  Viral or bacterial infections. Chronic cough:  Infections.  Allergies.  Asthma.  Post-nasal drip.  Smoking.  Heartburn or acid reflux.  Some medicines.  Chronic lung problems (COPD).  Cancer. SYMPTOMS   Cough.  Fever.  Chest pain.  Increased breathing rate.  High-pitched whistling sound when breathing (wheezing).  Colored mucus that you cough up (sputum). TREATMENT   A bacterial cough may be treated with antibiotic medicine.  A viral cough must run its course and will not respond to antibiotics.  Your caregiver may recommend other treatments if you have a chronic cough. HOME CARE INSTRUCTIONS   Only take over-the-counter or prescription medicines for pain, discomfort, or fever as directed by your caregiver. Use cough suppressants only as directed by your caregiver.  Use a cold steam vaporizer or humidifier in your bedroom or home to help loosen secretions.  Sleep in a semi-upright position if your cough is worse at night.  Rest as needed.  Stop smoking if you smoke. SEEK IMMEDIATE MEDICAL CARE IF:   You have pus in your sputum.  Your cough starts to worsen.  You cannot control your cough with suppressants and are losing sleep.  You begin coughing up blood.  You have difficulty breathing.  You develop  pain which is getting worse or is uncontrolled with medicine.  You have a fever. MAKE SURE YOU:   Understand these instructions.  Will watch your condition.  Will get help right away if you are not doing well or get worse. Document Released: 09/21/2010 Document Revised: 06/17/2011 Document Reviewed: 09/21/2010 [facility] Patient Information 2015 [place], [place]. This information is not intended to replace advice given to you by your health care provider. Make sure you discuss any questions you have with your health care provider.  Upper Respiratory Infection, Adult An upper respiratory infection (URI) is also sometimes known as the common cold. The upper respiratory tract includes the nose, sinuses, throat, trachea, and bronchi. Bronchi are the airways leading to the lungs. Most people improve within 1 week, but symptoms can last up to 2 weeks. A residual cough may last even longer.  CAUSES Many different viruses can infect the tissues lining the upper respiratory tract. The tissues become irritated and inflamed and often become very moist. Mucus production is also common. A cold is contagious. You can easily spread the virus to others by oral contact. This includes kissing, sharing a glass, coughing, or sneezing. Touching your mouth or nose and then touching a surface, which is then touched by another person, can also spread the virus. SYMPTOMS  Symptoms typically develop 1 to 3 days after you come in contact with a cold virus. Symptoms vary from person to person. They may include:  Runny nose.  Sneezing.  Nasal congestion.  Sinus irritation.  Sore throat.  Loss of voice (laryngitis).  Cough.  Fatigue.  Muscle aches.  Loss of appetite.  Headache.  Low-grade fever. DIAGNOSIS  You might diagnose your own cold based on familiar symptoms, since most people get a cold 2 to 3 times a year. Your caregiver can confirm this based on your exam. Most importantly, your caregiver can  check that your symptoms are not due to another disease such as strep throat, sinusitis, pneumonia, asthma, or epiglottitis. Blood tests, throat tests, and X-rays are not necessary to diagnose a common cold, but they may sometimes be helpful in excluding other more serious diseases. Your caregiver will decide if any further tests are required. RISKS AND COMPLICATIONS  You may be at risk for a more severe case of the common cold if you smoke cigarettes, have chronic heart disease (such as heart failure) or lung disease (such as asthma), or if you have a weakened immune system. The very young and very old are also at risk for more serious infections. Bacterial sinusitis, middle ear infections, and bacterial pneumonia can complicate the common cold. The common cold can worsen asthma and chronic obstructive pulmonary disease (COPD). Sometimes, these complications can require emergency medical care and may be life-threatening. PREVENTION  The best way to protect against getting a cold is to practice good hygiene. Avoid oral or hand contact with people with cold symptoms. Wash your hands often if contact occurs. There is no clear evidence that vitamin C, vitamin E, echinacea, or exercise reduces the chance of developing a cold. However, it is always recommended to get plenty of rest and practice good nutrition. TREATMENT  Treatment is directed at relieving symptoms. There is no cure. Antibiotics are not effective, because the infection is caused by a virus, not by bacteria. Treatment may include:  Increased fluid intake. Sports drinks offer valuable electrolytes, sugars, and fluids.  Breathing heated mist or steam (vaporizer or shower).  Eating chicken soup or other clear broths, and maintaining good nutrition.  Getting plenty of rest.  Using gargles or lozenges for comfort.  Controlling fevers with ibuprofen or acetaminophen as directed by your caregiver.  Increasing usage of your inhaler if you have  asthma. Zinc gel and zinc lozenges, taken in the first 24 hours of the common cold, can shorten the duration and lessen the severity of symptoms. Pain medicines may help with fever, muscle aches, and throat pain. A variety of non-prescription medicines are available to treat congestion and runny nose. Your caregiver can make recommendations and may suggest nasal or lung inhalers for other symptoms.  HOME CARE INSTRUCTIONS   Only take over-the-counter or prescription medicines for pain, discomfort, or fever as directed by your caregiver.  Use a warm mist humidifier or inhale steam from a shower to increase air moisture. This may keep secretions moist and make it easier to breathe.  Drink enough water and fluids to keep your urine clear or pale yellow.  Rest as needed.  Return to work when your temperature has returned to normal or as your caregiver advises. You may need to stay home longer to avoid infecting others. You can also use a face mask and careful hand washing to prevent spread of the virus. SEEK MEDICAL CARE IF:   After the first few days, you feel you are getting worse rather than better.  You need your caregiver's advice about medicines to control symptoms.  You develop chills, worsening shortness of breath, or brown or red sputum. These may be signs of pneumonia.  You develop yellow or brown nasal discharge or pain in the face, especially when you bend forward. These may be signs of sinusitis.  You develop a fever, swollen neck glands, pain with swallowing, or white areas in the back of your throat. These may be signs of strep throat. SEEK IMMEDIATE MEDICAL CARE IF:   You have a fever.  You develop severe or persistent headache, ear pain, sinus pain, or chest pain.  You develop wheezing, a prolonged cough, cough up blood, or have a change in your usual mucus (if you have chronic lung disease).  You develop sore muscles or a stiff neck. Document Released: 09/18/2000  Document Revised: 06/17/2011 Document Reviewed: 06/30/2013 [facility] Patient Information 2015 [place]. This information is not intended to replace advice given to you by your health care provider. Make sure you discuss any questions you have with your health care provider.

## 2014-05-27 NOTE — ED Notes (Signed)
Pt. reports persistent productive cough with nasal congestion and headache for several days , denies fever or chills, respirations unlabored.

## 2014-05-27 NOTE — ED Provider Notes (Signed)
CSN: [id number]     Arrival date & time 05/27/14  0050 History   First MD Initiated Contact with Patient 05/27/14 0138     Chief Complaint  Patient presents with  . Cough    (Consider location/radiation/quality/duration/timing/severity/associated sxs/prior Treatment) HPI Comments: Patient is a 24 year old female with a history of diabetes mellitus, hypertension, and asthma who presents to the emergency department for further evaluation of cough. Patient states that cough has been productive of phlegm and associated with nasal congestion and headache. Patient also reports a pain in her chest which she describes as burning and worse with coughing. Patient reports a fever of 101.68F yesterday. She has been taking Robitussin, Tylenol, and tea with honey and lemon with little effect. Patient states that her son was sick with an upper respiratory illness, but is feeling better now. She has been having associated nausea with sporadic, nonbloody emesis. Patient denies shortness of breath, hematemesis, hemoptysis, abdominal pain, diarrhea, and syncope.  Patient is a 24 y.o. female presenting with cough. The history is provided by the patient. No language interpreter was used.  Cough Associated symptoms: chest pain and fever     Past Medical History  Diagnosis Date  . Diabetes mellitus without complication   . Hypertension   . Asthma    Past Surgical History  Procedure Laterality Date  . Cholecystectomy     No family history on file. History  Substance Use Topics  . Smoking status: Never Smoker   . Smokeless tobacco: Not on file  . Alcohol Use: Yes   OB History    No data available      Review of Systems  Constitutional: Positive for fever.  Respiratory: Positive for cough and chest tightness.   Cardiovascular: Positive for chest pain.  Gastrointestinal: Positive for nausea and vomiting. Negative for abdominal pain and diarrhea.  Neurological: Negative for syncope.  All other  systems reviewed and are negative.   Allergies  Review of patient's allergies indicates no known allergies.  Home Medications   Prior to Admission medications   Medication Sig Start Date End Date Taking? Authorizing Provider  acetaminophen (TYLENOL) 500 MG tablet Take 1,000 mg by mouth every 6 (six) hours as needed (pain).   Yes Historical Provider, MD  cyclobenzaprine (FLEXERIL) 5 MG tablet Take 1 tablet (5 mg total) by mouth 3 (three) times daily as needed for muscle spasms. Patient not taking: Reported on 03/25/2014 02/19/14   Tilden Fossa, MD  loperamide (IMODIUM) 2 MG capsule Take 1 capsule (2 mg total) by mouth 4 (four) times daily as needed for diarrhea or loose stools. Patient not taking: Reported on 02/19/2014 02/03/14   Shon Baton, MD  ondansetron (ZOFRAN) 4 MG tablet Take 1 tablet (4 mg total) by mouth every 8 (eight) hours as needed for nausea or vomiting. Patient not taking: Reported on 02/19/2014 02/03/14   Shon Baton, MD   BP 156/103 mmHg  Pulse 96  Temp(Src) 99.7 F (37.6 C) (Oral)  Resp 16  Ht  (1.676 m)  Wt 189 lb (85.73 kg)  BMI 30.52 kg/m2  SpO2 98%   Physical Exam  Constitutional: She is oriented to person, place, and time. She appears well-developed and well-nourished. No distress.  Nontoxic/nonseptic appearing  HENT:  Head: Normocephalic and atraumatic.  Right Ear: Tympanic membrane, external ear and ear canal normal.  Left Ear: Tympanic membrane, external ear and ear canal normal.  Nose: Nose normal.  Mouth/Throat: Uvula is midline, oropharynx is clear and moist  and mucous membranes are normal.  Uvula midline. No posterior oropharyngeal erythema or edema. Patient tolerating secretions without difficulty.  Eyes: Conjunctivae and EOM are normal. No scleral icterus.  Neck: Normal range of motion.  No JVD  Cardiovascular: Normal rate, regular rhythm and normal heart sounds.   Pulmonary/Chest: Effort normal and breath sounds normal. No  respiratory distress. She has no wheezes. She has no rales.  Intermittent, congested sounding and nonproductive cough appreciated at bedside. Lungs clear bilaterally. No tachypnea or dyspnea.  Musculoskeletal: Normal range of motion.  Neurological: She is alert and oriented to person, place, and time. She exhibits normal muscle tone. Coordination normal.  Skin: Skin is warm and dry. No rash noted. She is not diaphoretic. No erythema. No pallor.  Psychiatric: She has a normal mood and affect. Her behavior is normal.  Nursing note and vitals reviewed.   ED Course  Procedures (including critical care time) Labs Review Labs Reviewed - No data to display  Imaging Review Dg Chest 2 View  05/27/2014   CLINICAL DATA:  Productive cough and mid chest pain for 4-5 days  EXAM: CHEST  2 VIEW  COMPARISON:  None.  FINDINGS: Mild streaky opacity in the left base may be atelectatic. The lungs are otherwise clear. There are no pleural effusions. Hilar and mediastinal contours are unremarkable. Pulmonary vasculature is normal.  IMPRESSION: Mild streaky left base opacities, likely atelectatic.   Electronically Signed   By: Ellery Plunkaniel R Mitchell M.D.   On: 05/27/2014 02:09     EKG Interpretation None      MDM   Final diagnoses:  Cough  URI (upper respiratory infection)    24 year old female presents to the emergency department for further evaluation of cough with associated nasal congestion and fever. Afebrile in ED. Patient is nontoxic/nonseptic appearing. Lungs are clear bilaterally. Patient exhibits no tachypnea or dyspnea. She is not hypoxic. Patient reports sick contacts; her son was sick with an upper respiratory infection recently.  Chest x-ray obtained which shows mild streaky left base opacities favored to be atelectasis. Given patient's history of symptoms over the past 4 days with associated fever, will cover for infection with Z-pack. Patient also given albuterol inhaler and prescription for  Tessalon, Hycodan, and naproxen for outpatient management of her symptoms. Patient advised to follow-up with a primary care provider for recheck in 3 days. Return precautions discussed and patient agreeable to plan with no unaddressed concerns. Patient discharged in good condition; VSS.   Filed Vitals:   05/27/14 0056 05/27/14 0320  BP: 156/103 155/100  Pulse: 96 95  Temp: 99.7 F (37.6 C) 99.5 F (37.5 C)  TempSrc: Oral Oral  Resp: 16 15  Height: 5\' 6"  (1.676 m)   Weight: 189 lb (85.73 kg)   SpO2: 98% 100%     Antony MaduraKelly Sareen Randon, PA-C 05/27/14 [id number]  Olivia Mackielga M Otter, MD 05/27/14 [phone]

## 2014-05-27 NOTE — ED Notes (Signed)
Pt. Left with all belongings and refused wheelchair 

## 2014-05-27 NOTE — ED Notes (Signed)
Patient transported to X-ray 

## 2014-07-02 ENCOUNTER — Inpatient Hospital Stay (HOSPITAL_COMMUNITY)
Admission: EM | Admit: 2014-07-02 | Discharge: 2014-07-02 | Disposition: A | Discharge status: Home / Self Care | Payer: PRIVATE HEALTH INSURANCE | Attending: Obstetrics & Gynecology | Admitting: Obstetrics & Gynecology

## 2014-07-02 ENCOUNTER — Encounter (HOSPITAL_COMMUNITY): Payer: Self-pay

## 2014-07-02 DIAGNOSIS — R1030 Lower abdominal pain, unspecified: Secondary | ICD-10-CM | POA: Insufficient documentation

## 2014-07-02 DIAGNOSIS — I10 Essential (primary) hypertension: Secondary | ICD-10-CM | POA: Insufficient documentation

## 2014-07-02 DIAGNOSIS — N926 Irregular menstruation, unspecified: Secondary | ICD-10-CM

## 2014-07-02 DIAGNOSIS — Z8742 Personal history of other diseases of the female genital tract: Secondary | ICD-10-CM

## 2014-07-02 DIAGNOSIS — J45909 Unspecified asthma, uncomplicated: Secondary | ICD-10-CM | POA: Insufficient documentation

## 2014-07-02 DIAGNOSIS — N939 Abnormal uterine and vaginal bleeding, unspecified: Secondary | ICD-10-CM | POA: Insufficient documentation

## 2014-07-02 DIAGNOSIS — E282 Polycystic ovarian syndrome: Secondary | ICD-10-CM | POA: Insufficient documentation

## 2014-07-02 DIAGNOSIS — E119 Type 2 diabetes mellitus without complications: Secondary | ICD-10-CM | POA: Insufficient documentation

## 2014-07-02 HISTORY — DX: Polycystic ovarian syndrome: E28.2

## 2014-07-02 LAB — COMPREHENSIVE METABOLIC PANEL
ALBUMIN: 4.2 g/dL (ref 3.5–5.2)
ALK PHOS: 102 U/L (ref 39–117)
ALT: 19 U/L (ref 0–35)
AST: 20 U/L (ref 0–37)
Anion gap: 7 (ref 5–15)
BUN: 14 mg/dL (ref 6–23)
CO2: 26 mmol/L (ref 19–32)
CREATININE: 0.67 mg/dL (ref 0.50–1.10)
Calcium: 9.4 mg/dL (ref 8.4–10.5)
Chloride: 103 mmol/L (ref 96–112)
GFR calc non Af Amer: >90 mL/min (ref >90)
Glucose, Bld: 102 mg/dL — ABNORMAL HIGH (ref 70–99)
Potassium: 3.5 mmol/L (ref 3.5–5.1)
Sodium: 136 mmol/L (ref 135–145)
Total Bilirubin: 0.3 mg/dL (ref 0.3–1.2)
Total Protein: 7.9 g/dL (ref 6.0–8.3)

## 2014-07-02 LAB — CBC WITH DIFFERENTIAL/PLATELET
Basophils Absolute: 0 10*3/uL (ref 0.0–0.1)
Basophils Relative: 1 % (ref 0–1)
Eosinophils Absolute: 0.1 10*3/uL (ref 0.0–0.7)
Eosinophils Relative: 1 % (ref 0–5)
HCT: 37.3 % (ref 36.0–46.0)
Hemoglobin: 12.2 g/dL (ref 12.0–15.0)
LYMPHS PCT: 46 % (ref 12–46)
Lymphs Abs: 2.6 10*3/uL (ref 0.7–4.0)
MCH: 27.1 pg (ref 26.0–34.0)
MCHC: 32.7 g/dL (ref 30.0–36.0)
MCV: 82.7 fL (ref 78.0–100.0)
MONOS PCT: 8 % (ref 3–12)
Monocytes Absolute: 0.5 10*3/uL (ref 0.1–1.0)
Neutro Abs: 2.5 10*3/uL (ref 1.7–7.7)
Neutrophils Relative %: 44 % (ref 43–77)
PLATELETS: 366 10*3/uL (ref 150–400)
RBC: 4.51 MIL/uL (ref 3.87–5.11)
RDW: 13.9 % (ref 11.5–15.5)
WBC: 5.7 10*3/uL (ref 4.0–10.5)

## 2014-07-02 LAB — HCG, QUANTITATIVE, PREGNANCY: hCG, Beta Chain, Quant, S: <1 m[IU]/mL (ref <5)

## 2014-07-02 NOTE — Discharge Instructions (Signed)
Abnormal Uterine Bleeding Abnormal uterine bleeding can affect women at various stages in life, including teenagers, women in their reproductive years, pregnant women, and women who have reached menopause. Several kinds of uterine bleeding are considered abnormal, including:  Bleeding or spotting between periods.   Bleeding after sexual intercourse.   Bleeding that is heavier or more than normal.   Periods that last longer than usual.  Bleeding after menopause.  Many cases of abnormal uterine bleeding are minor and simple to treat, while others are more serious. Any type of abnormal bleeding should be evaluated by your health care provider. Treatment will depend on the cause of the bleeding. HOME CARE INSTRUCTIONS Monitor your condition for any changes. The following actions may help to alleviate any discomfort you are experiencing:  Avoid the use of tampons and douches as directed by your health care provider.  Change your pads frequently. You should get regular pelvic exams and Pap tests. Keep all follow-up appointments for diagnostic tests as directed by your health care provider.  SEEK MEDICAL CARE IF:   Your bleeding lasts more than 1 week.   You feel dizzy at times.  SEEK IMMEDIATE MEDICAL CARE IF:   You pass out.   You are changing pads every 15 to 30 minutes.   You have abdominal pain.  You have a fever.   You become sweaty or weak.   You are passing large blood clots from the vagina.   You start to feel nauseous and vomit. MAKE SURE YOU:   Understand these instructions.  Will watch your condition.  Will get help right away if you are not doing well or get worse. Document Released: 03/25/2005 Document Revised: 03/30/2013 Document Reviewed: 10/22/2012 [facility] Patient Information 2015 [place], [place]. This information is not intended to replace advice given to you by your health care provider. Make sure you discuss any questions you have with your  health care provider. Polycystic Ovarian Syndrome Polycystic ovarian syndrome (PCOS) is a common hormonal disorder among women of reproductive age. Most women with PCOS grow many small cysts on their ovaries. PCOS can cause problems with your periods and make it difficult to get pregnant. It can also cause an increased risk of miscarriage with pregnancy. If left untreated, PCOS can lead to serious health problems, such as diabetes and heart disease. CAUSES The cause of PCOS is not fully understood, but genetics may be a factor. SIGNS AND SYMPTOMS   Infrequent or no menstrual periods.   Inability to get pregnant (infertility) because of not ovulating.   Increased growth of hair on the face, chest, stomach, back, thumbs, thighs, or toes.   Acne, oily skin, or dandruff.   Pelvic pain.   Weight gain or obesity, usually carrying extra weight around the waist.   Type 2 diabetes.   High cholesterol.   High blood pressure.   Female-pattern baldness or thinning hair.   Patches of thickened and dark brown or black skin on the neck, arms, breasts, or thighs.   Tiny excess flaps of skin (skin tags) in the armpits or neck area.   Excessive snoring and having breathing stop at times while asleep (sleep apnea).   Deepening of the voice.   Gestational diabetes when pregnant.  DIAGNOSIS  There is no single test to diagnose PCOS.   Your health care provider will:   Take a medical history.   Perform a pelvic exam.   Have ultrasonography done.   Check your female and female hormone levels.  Measure glucose or sugar levels in the blood.   Do other blood tests.   If you are producing too many female hormones, your health care provider will make sure it is from PCOS. At the physical exam, your health care provider will want to evaluate the areas of increased hair growth. Try to allow natural hair growth for a few days before the visit.   During a pelvic exam, the  ovaries may be enlarged or swollen because of the increased number of small cysts. This can be seen more easily by using vaginal ultrasonography or screening to examine the ovaries and lining of the uterus (endometrium) for cysts. The uterine lining may become thicker if you have not been having a regular period.  TREATMENT  Because there is no cure for PCOS, it needs to be managed to prevent problems. Treatments are based on your symptoms. Treatment is also based on whether you want to have a baby or whether you need contraception.  Treatment may include:   Progesterone hormone to start a menstrual period.   Birth control pills to make you have regular menstrual periods.   Medicines to make you ovulate, if you want to get pregnant.   Medicines to control your insulin.   Medicine to control your blood pressure.   Medicine and diet to control your high cholesterol and triglycerides in your blood.  Medicine to reduce excessive hair growth.  Surgery, making small holes in the ovary, to decrease the amount of female hormone production. This is done through a long, lighted tube (laparoscope) placed into the pelvis through a tiny incision in the lower abdomen.  HOME CARE INSTRUCTIONS  Only take over-the-counter or prescription medicine as directed by your health care provider.  Pay attention to the foods you eat and your activity levels. This can help reduce the effects of PCOS.  Keep your weight under control.  Eat foods that are low in carbohydrate and high in fiber.  Exercise regularly. SEEK MEDICAL CARE IF:  Your symptoms do not get better with medicine.  You have new symptoms. Document Released: 07/19/2004 Document Revised: 01/13/2013 Document Reviewed: 09/10/2012 [facility] Patient Information 2015 [place], [place]. This information is not intended to replace advice given to you by your health care provider. Make sure you discuss any questions you have with your health care  provider.   PREGNANCY test--BHCG <1

## 2014-07-02 NOTE — Progress Notes (Signed)
ED MD, Dr. Gwendolyn GrantWalden in. Informal U/S done.Unable to visualize cardiac motion or fetal movement.

## 2014-07-02 NOTE — Progress Notes (Signed)
Dr. Erin FullingHarraway-Smith notified that pt is either 206/[redacted] weeks pregnant or 19 3/[redacted] weeks pregnant, depending on which due date used. Pt is having abd pain, vaginal bleeding, and I am unable to find fetal heart tones. Informal bedside U/S performed by Dr. Gwendolyn GrantWalden unable to visualize cardiac movement or fetal movement. [facility].

## 2014-07-02 NOTE — ED Notes (Signed)
[facility] has been called.  States they will be here when the night truck comes on.

## 2014-07-02 NOTE — MAU Note (Signed)
Eve NP at bedside with pt. Reviewed pt history and results of Hcg less than one and explained that it meant that the pt was not pregnant. Pt expressed relief that she is not pregnant and knows what caused the bleeding. NP offered pt pelvic exam and evaluation of bleeding and pt declined multiple offers for evaluation of bleeding. States she will follow up [place] gyn in [place]

## 2014-07-02 NOTE — MAU Note (Signed)
Pt presents via [facility] for evaluation of vaginal bleeding. HX of HTN outside of pregnancy and Type 2 diabetes. Pt also has lower abdominal pain.

## 2014-07-02 NOTE — Progress Notes (Signed)
Pt presents with c/o vaginal bleeding and passing clots. Says she is [redacted] weeks pregnant. Unable to find heart tones with a doppler or cardio. Pt's underwear are blood soaked. BP is 153/86. Says she has high blood pressure when she is not pregnant. Says her 1st pregnancy was complicated by placenta previa that resolved 2 weeks before her delivery. She did have a vaginal delivery with that child. 2 nd pregnancy was a miscarriage. Pt says that she has been living here for 3 months. [place] [facility].

## 2014-07-02 NOTE — Progress Notes (Signed)
Report given to Maralyn SagoSarah, charge nurse, [facility], [facility].

## 2014-07-02 NOTE — MAU Provider Note (Signed)
History     CSN: [id number]  Arrival date and time: 07/02/14 1746   None     Chief Complaint  Patient presents with  . Abdominal Pain  . Vaginal Bleeding   HPI Brittany Dalton is 24 y.o. [id number] [redacted]w[redacted]d weeks presents after being transferred from [facility] for evaluation of vaginal bleeding.  Dr. Erin Fulling was called about patient and instructed to send her here for further evaluation after it was reported that U/S showed no FHT or movement.  Per labs drawn tonight BHCG is <1. NOTE:  She had Neg UPT in our system 03/25/14.   Hx of  Diabetes, hypertension, asthma.  Has  PCOS, states she doesn't have her periods very often.  LMP 01/13/14  that was "shorter"  than normal.  + UPT Clinic in [place] in December.  Had spotting last month, seen when urinating.  Heavy bleeding began today.  She states that she just wanted to know what was causing the bleeding and now she is going to start the pack of birth control pills she has at home.  Planning to return to [facility] tomorrow and will see her OBGYN for refills.  She is not upset with the BHCG results that showed she is not pregnancy.   Past Medical History  Diagnosis Date  . Diabetes mellitus without complication   . Hypertension   . Asthma   . PCOS (polycystic ovarian syndrome)     Past Surgical History  Procedure Laterality Date  . Cholecystectomy      History reviewed. No pertinent family history.  History  Substance Use Topics  . Smoking status: Never Smoker   . Smokeless tobacco: Not on file  . Alcohol Use: Yes    Allergies: No Known Allergies  Prescriptions prior to admission  Medication Sig Dispense Refill Last Dose  . acetaminophen (TYLENOL) 500 MG tablet Take 1,000 mg by mouth every 6 (six) hours as needed (pain).   07/02/2014 at Unknown time  . Docosahexaenoic Acid (PRENATAL DHA PO) Take 1 tablet by mouth 2 (two) times daily.   07/02/2014 at Unknown time  . azithromycin (ZITHROMAX) 250 MG tablet Take 1 tablet (250 mg total) by  mouth daily. Take first 2 tablets together, then 1 every day until finished. 6 tablet 0   . benzonatate (TESSALON) 100 MG capsule Take 1 capsule (100 mg total) by mouth every 8 (eight) hours. 21 capsule 0   . cyclobenzaprine (FLEXERIL) 5 MG tablet Take 1 tablet (5 mg total) by mouth 3 (three) times daily as needed for muscle spasms. (Patient not taking: Reported on 03/25/2014) 12 tablet 0 Not Taking at Unknown time  . HYDROcodone-homatropine (HYCODAN) 5-1.5 MG/5ML syrup Take 5 mLs by mouth every 6 (six) hours as needed for cough. 120 mL 0   . loperamide (IMODIUM) 2 MG capsule Take 1 capsule (2 mg total) by mouth 4 (four) times daily as needed for diarrhea or loose stools. (Patient not taking: Reported on 02/19/2014) 12 capsule 0 Not Taking at Unknown time  . naproxen (NAPROSYN) 500 MG tablet Take 1 tablet (500 mg total) by mouth 2 (two) times daily. 30 tablet 0   . ondansetron (ZOFRAN) 4 MG tablet Take 1 tablet (4 mg total) by mouth every 8 (eight) hours as needed for nausea or vomiting. (Patient not taking: Reported on 02/19/2014) 15 tablet 0 Not Taking at Unknown time    Review of Systems  Constitutional: Negative for fever and chills.  Gastrointestinal: Positive for abdominal pain (cramping). Negative for  nausea and vomiting.  Genitourinary: Negative for dysuria, urgency, frequency and hematuria.       + for heavy vaginal bleeding   Physical Exam   Blood pressure 134/81, pulse 95, temperature 98 F (36.7 C), temperature source Oral, resp. rate 18, last menstrual period 01/05/2014, SpO2 100 %.  Physical Exam  Vitals reviewed. Constitutional: She appears well-developed and well-nourished. No distress.  HENT:  Head: Normocephalic.  Psychiatric: She has a normal mood and affect. Her behavior is normal. Thought content normal.   PATIENT DECLINED PELVIC EXAM--OFFERED TWICE< REFUSED X 2  Results for orders placed or performed during the hospital encounter of 07/02/14 (from the past 24  hour(s))  CBC with Differential/Platelet     Status: None   Collection Time: 07/02/14  6:16 PM  Result Value Ref Range   WBC 5.7 4.0 - 10.5 K/uL   RBC 4.51 3.87 - 5.11 MIL/uL   Hemoglobin 12.2 12.0 - 15.0 g/dL   HCT 96.0 45.4 - 09.8 %   MCV 82.7 78.0 - 100.0 fL   MCH 27.1 26.0 - 34.0 pg   MCHC 32.7 30.0 - 36.0 g/dL   RDW 11.9 14.7 - 82.9 %   Platelets 366 150 - 400 K/uL   Neutrophils Relative % 44 43 - 77 %   Neutro Abs 2.5 1.7 - 7.7 K/uL   Lymphocytes Relative 46 12 - 46 %   Lymphs Abs 2.6 0.7 - 4.0 K/uL   Monocytes Relative 8 3 - 12 %   Monocytes Absolute 0.5 0.1 - 1.0 K/uL   Eosinophils Relative 1 0 - 5 %   Eosinophils Absolute 0.1 0.0 - 0.7 K/uL   Basophils Relative 1 0 - 1 %   Basophils Absolute 0.0 0.0 - 0.1 K/uL  Comprehensive metabolic panel     Status: Abnormal   Collection Time: 07/02/14  6:16 PM  Result Value Ref Range   Sodium 136 135 - 145 mmol/L   Potassium 3.5 3.5 - 5.1 mmol/L   Chloride 103 96 - 112 mmol/L   CO2 26 19 - 32 mmol/L   Glucose, Bld 102 (H) 70 - 99 mg/dL   BUN 14 6 - 23 mg/dL   Creatinine, Ser 5.62 0.50 - 1.10 mg/dL   Calcium 9.4 8.4 - 13.0 mg/dL   Total Protein 7.9 6.0 - 8.3 g/dL   Albumin 4.2 3.5 - 5.2 g/dL   AST 20 0 - 37 U/L   ALT 19 0 - 35 U/L   Alkaline Phosphatase 102 39 - 117 U/L   Total Bilirubin 0.3 0.3 - 1.2 mg/dL   GFR calc non Af Amer >90 >90 mL/min   GFR calc Af Amer >90 >90 mL/min   Anion gap 7 5 - 15  hCG, quantitative, pregnancy     Status: None   Collection Time: 07/02/14  6:17 PM  Result Value Ref Range   hCG, Beta Chain, Quant, S <1 <5 mIU/mL  ABO/Rh     Status: None   Collection Time: 07/02/14  6:30 PM  Result Value Ref Range   ABO/RH(D) O POS    MAU Course  Procedures  MDM Dr. Erin Fulling informed of BHCG result of <1  Assessment and Plan  A:  Vaginal Bleeding      Negative serum pregnancy test       Hx of PCOS      Probable menses  P:  Results were reported and explained to the patient       Patient  leaving  for [place] tomorrow and plans follow up with her OB GYN there  She has 1 pack of pills that she wants to start tomorrow.  She will get refills at time of [place] f[place]u visit in [place]  Edgardo Petrenko,EVE M 07/02/2014, 8:23 PM

## 2014-07-02 NOTE — ED Notes (Signed)
Pt presents with c/o vaginal bleeding and abdominal pain that started last night. Pt reports she is approx [redacted] weeks pregnant and started experiencing these symptoms last night. Pt reports that she is passing clots.

## 2014-07-02 NOTE — Progress Notes (Addendum)
PA in. Informal ultrasound being done. Unable to see cardiac motion or fetal movement. Pt says she has 2 due dates. The first is 11/13/2014, which makes her 20 6/7 weeks. The 2nd is 11/23/2014 which puts her at 19 3/7 weeks.

## 2014-07-02 NOTE — ED Notes (Signed)
OB rapid response called for patient.

## 2014-07-02 NOTE — ED Provider Notes (Signed)
CSN: [id number]     Arrival date & time 07/02/14  1746 History   First MD Initiated Contact with Patient 07/02/14 1755     Chief Complaint  Patient presents with  . Abdominal Pain  . Vaginal Bleeding     (Consider location/radiation/quality/duration/timing/severity/associated sxs/prior Treatment) HPI Comments: Patient who is currently [redacted] weeks pregnant presents today with lower abdominal cramping and vaginal bleeding.  She reports that she began having bleeding last evening.  She reports that today she began passing large blood clots and the pain became worse.  She states that she had been getting prenatal care by an OB in [place] and has not had any complications with the pregnancy thus far.  She reports that the babies fetal heart tones have been normal when checked by the OB in [place].  She denies nausea, vomiting, dizziness, syncope, chest pain, or SOB.  She states that her LMP was 01/13/14.  She is [id number].     The history is provided by the patient.    Past Medical History  Diagnosis Date  . Diabetes mellitus without complication   . Hypertension   . Asthma    Past Surgical History  Procedure Laterality Date  . Cholecystectomy     No family history on file. History  Substance Use Topics  . Smoking status: Never Smoker   . Smokeless tobacco: Not on file  . Alcohol Use: Yes   OB History    Gravida Para Term Preterm AB TAB SAB Ectopic Multiple Living   1              Review of Systems  All other systems reviewed and are negative.     Allergies  Review of patient's allergies indicates no known allergies.  Home Medications   Prior to Admission medications   Medication Sig Start Date End Date Taking? Authorizing Provider  acetaminophen (TYLENOL) 500 MG tablet Take 1,000 mg by mouth every 6 (six) hours as needed (pain).   Yes Historical Provider, MD  Docosahexaenoic Acid (PRENATAL DHA PO) Take 1 tablet by mouth 2 (two) times daily.   Yes Historical Provider,  MD  azithromycin (ZITHROMAX) 250 MG tablet Take 1 tablet (250 mg total) by mouth daily. Take first 2 tablets together, then 1 every day until finished. 05/27/14   Antony MaduraKelly Humes, PA-C  benzonatate (TESSALON) 100 MG capsule Take 1 capsule (100 mg total) by mouth every 8 (eight) hours. 05/27/14   Antony MaduraKelly Humes, PA-C  cyclobenzaprine (FLEXERIL) 5 MG tablet Take 1 tablet (5 mg total) by mouth 3 (three) times daily as needed for muscle spasms. Patient not taking: Reported on 03/25/2014 02/19/14   Tilden FossaElizabeth Rees, MD  HYDROcodone-homatropine [facility](HYCODAN) 5-1.5 MG/5ML syrup Take 5 mLs by mouth every 6 (six) hours as needed for cough. 05/27/14   Antony MaduraKelly Humes, PA-C  loperamide (IMODIUM) 2 MG capsule Take 1 capsule (2 mg total) by mouth 4 (four) times daily as needed for diarrhea or loose stools. Patient not taking: Reported on 02/19/2014 02/03/14   Shon Batonourtney F Horton, MD  naproxen (NAPROSYN) 500 MG tablet Take 1 tablet (500 mg total) by mouth 2 (two) times daily. 05/27/14   Antony MaduraKelly Humes, PA-C  ondansetron (ZOFRAN) 4 MG tablet Take 1 tablet (4 mg total) by mouth every 8 (eight) hours as needed for nausea or vomiting. Patient not taking: Reported on 02/19/2014 02/03/14   Shon Batonourtney F Horton, MD   BP 166/89 mmHg  Pulse 106  Temp(Src) 98.1 F (36.7 C) (Oral)  Resp  20  SpO2 100%  LMP 01/05/2014 (Approximate) Physical Exam  Constitutional: She appears well-developed and well-nourished.  HENT:  Head: Normocephalic and atraumatic.  Mouth/Throat: Oropharynx is clear and moist.  Neck: Normal range of motion. Neck supple.  Cardiovascular: Normal rate, regular rhythm and normal heart sounds.   Pulmonary/Chest: Effort normal and breath sounds normal.  Abdominal: Soft. Bowel sounds are normal. She exhibits distension. She exhibits no mass. There is no tenderness. There is no rebound and no guarding.  Genitourinary:  Pelvic exam not performed due to the request of the OB at [facility]  Musculoskeletal: Normal range of  motion.  Neurological: She is alert.  Skin: Skin is warm and dry.  Psychiatric: She has a normal mood and affect.  Nursing note and vitals reviewed.   ED Course  Procedures (including critical care time) Labs Review Labs Reviewed  WET PREP, GENITAL  CBC WITH DIFFERENTIAL/PLATELET  COMPREHENSIVE METABOLIC PANEL  HCG, QUANTITATIVE, PREGNANCY  ABO/RH  GC/CHLAMYDIA PROBE AMP ([place])    Imaging Review No results found.   EKG Interpretation None     6:15 PM Rapid response OB Nurse in the room with the patient.  Unable to get fetal heart tones.  Rapid response nurse immediately called Dr Dolan Amen the [facility] at [facility].  Dr. Dolan Amen recommends immediate transfer to [facility].  She states that there is no need to do a pelvic exam in the ED because it will need to be repeated at [facility].  Dr. Gwendolyn Grant also in the patient's room to evaluate the patient.   MDM   Final diagnoses:  None   Patient presented to the ED today stating that she was [redacted] weeks pregnant and had started having vaginal bleeding yesterday, which continued and worsened today.  She reports that she had been getting normal prenatal care by her OB/GYN in [place] and had not had any complications with the pregnancy thus far.  Upon arrival in the ED the rapid response nurse was immediately called by Nursing staff.  Rapid response nurse immediately evaluated the patient in the ED and could not find fetal heart tones.  She then immediately called the OB on call at [facility] and was instructed to transfer the patient over to [facility].  Patient appears stable.  VSS at time of transfer.  Labs have been drawn in the ED.  Patient also evaluated by Dr. Gwendolyn Grant.  Patient transferred to [facility] as instructed.      Santiago Glad, PA-C 07/05/14 2256  Elwin Mocha, MD 07/07/14 [phone]

## 2014-07-03 LAB — ABO/RH: ABO/RH(D): O POS

## 2014-08-06 ENCOUNTER — Emergency Department (HOSPITAL_COMMUNITY)
Admission: EM | Admit: 2014-08-06 | Discharge: 2014-08-06 | Disposition: A | Discharge status: Home / Self Care | Payer: PRIVATE HEALTH INSURANCE | Attending: Emergency Medicine | Admitting: Emergency Medicine

## 2014-08-06 ENCOUNTER — Encounter (HOSPITAL_COMMUNITY): Payer: Self-pay | Admitting: *Deleted

## 2014-08-06 DIAGNOSIS — I10 Essential (primary) hypertension: Secondary | ICD-10-CM | POA: Insufficient documentation

## 2014-08-06 DIAGNOSIS — R1031 Right lower quadrant pain: Secondary | ICD-10-CM

## 2014-08-06 DIAGNOSIS — R1032 Left lower quadrant pain: Secondary | ICD-10-CM

## 2014-08-06 DIAGNOSIS — E119 Type 2 diabetes mellitus without complications: Secondary | ICD-10-CM | POA: Insufficient documentation

## 2014-08-06 DIAGNOSIS — Z791 Long term (current) use of non-steroidal anti-inflammatories (NSAID): Secondary | ICD-10-CM | POA: Insufficient documentation

## 2014-08-06 DIAGNOSIS — R1030 Lower abdominal pain, unspecified: Secondary | ICD-10-CM | POA: Insufficient documentation

## 2014-08-06 DIAGNOSIS — J45909 Unspecified asthma, uncomplicated: Secondary | ICD-10-CM | POA: Insufficient documentation

## 2014-08-06 LAB — CBC WITH DIFFERENTIAL/PLATELET
BASOS PCT: 1 % (ref 0–1)
Basophils Absolute: 0 10*3/uL (ref 0.0–0.1)
EOS ABS: 0.1 10*3/uL (ref 0.0–0.7)
Eosinophils Relative: 2 % (ref 0–5)
HCT: 34.6 % — ABNORMAL LOW (ref 36.0–46.0)
HEMOGLOBIN: 11.4 g/dL — AB (ref 12.0–15.0)
Lymphocytes Relative: 40 % (ref 12–46)
Lymphs Abs: 1.8 10*3/uL (ref 0.7–4.0)
MCH: 27 pg (ref 26.0–34.0)
MCHC: 32.9 g/dL (ref 30.0–36.0)
MCV: 81.8 fL (ref 78.0–100.0)
MONOS PCT: 7 % (ref 3–12)
Monocytes Absolute: 0.3 10*3/uL (ref 0.1–1.0)
NEUTROS ABS: 2.3 10*3/uL (ref 1.7–7.7)
Neutrophils Relative %: 50 % (ref 43–77)
PLATELETS: 314 10*3/uL (ref 150–400)
RBC: 4.23 MIL/uL (ref 3.87–5.11)
RDW: 13.7 % (ref 11.5–15.5)
WBC: 4.5 10*3/uL (ref 4.0–10.5)

## 2014-08-06 LAB — COMPREHENSIVE METABOLIC PANEL
ALT: 20 U/L (ref 0–35)
AST: 22 U/L (ref 0–37)
Albumin: 3.6 g/dL (ref 3.5–5.2)
Alkaline Phosphatase: 97 U/L (ref 39–117)
Anion gap: 6 (ref 5–15)
BUN: 14 mg/dL (ref 6–23)
CO2: 26 mmol/L (ref 19–32)
Calcium: 9.3 mg/dL (ref 8.4–10.5)
Chloride: 105 mmol/L (ref 96–112)
Creatinine, Ser: 0.66 mg/dL (ref 0.50–1.10)
GFR calc Af Amer: >90 mL/min (ref >90)
GLUCOSE: 114 mg/dL — AB (ref 70–99)
Potassium: 4 mmol/L (ref 3.5–5.1)
SODIUM: 137 mmol/L (ref 135–145)
Total Bilirubin: 0.5 mg/dL (ref 0.3–1.2)
Total Protein: 6.9 g/dL (ref 6.0–8.3)

## 2014-08-06 LAB — I-STAT BETA HCG BLOOD, ED (MC, WL, AP ONLY): I-stat hCG, quantitative: <5 m[IU]/mL (ref <5)

## 2014-08-06 LAB — URINALYSIS, ROUTINE W REFLEX MICROSCOPIC
Bilirubin Urine: NEGATIVE
GLUCOSE, UA: NEGATIVE mg/dL
Hgb urine dipstick: NEGATIVE
Ketones, ur: NEGATIVE mg/dL
LEUKOCYTES UA: NEGATIVE
Nitrite: NEGATIVE
PH: 5.5 (ref 5.0–8.0)
Protein, ur: NEGATIVE mg/dL
SPECIFIC GRAVITY, URINE: 1.027 (ref 1.005–1.030)
Urobilinogen, UA: 1 mg/dL (ref 0.0–1.0)

## 2014-08-06 LAB — WET PREP, GENITAL
CLUE CELLS WET PREP: NONE SEEN
Trich, Wet Prep: NONE SEEN
YEAST WET PREP: NONE SEEN

## 2014-08-06 MED ORDER — NAPROXEN 500 MG PO TABS: 500.0000 mg | ORAL_TABLET | Freq: Two times a day (BID) | ORAL | Status: DC

## 2014-08-06 MED ORDER — DICYCLOMINE HCL 20 MG PO TABS: 20.0000 mg | ORAL_TABLET | Freq: Two times a day (BID) | ORAL | Status: DC

## 2014-08-06 NOTE — ED Provider Notes (Signed)
CSN: [id number]     Arrival date & time 08/06/14  1030 History   First MD Initiated Contact with Patient 08/06/14 1053     Chief Complaint  Patient presents with  . Abdominal Pain     (Consider location/radiation/quality/duration/timing/severity/associated sxs/prior Treatment) HPI   Brittany Dalton This 24 year old female with a past medical history diabetes, hypertension, asthma, and PCOS who presents emergency Department with chief complaint of bilateral lower quadrant cramping abdominal pain, worse on the right. She states that she developed bilateral lower abdominal cramping pain yesterday. She describes it as intermittent, colicky, worse on the right, feels similar to previous episodes of her "PCOS" however, she is not having bleeding currently. Patient states her last bowel movement was 3 days ago, but that she normally only goes about every 3 days and has a large bowel movement. She denies any vaginal symptoms, dyspareunia, bleeding. She denies any urinary symptoms, back pain, fevers, chills, nausea, vomiting. Patient states that her pain at times is an 8 out of 10 and that she was taking Tylenol without relief.  Past Medical History  Diagnosis Date  . Diabetes mellitus without complication   . Hypertension   . Asthma   . PCOS (polycystic ovarian syndrome)    Past Surgical History  Procedure Laterality Date  . Cholecystectomy     No family history on file. History  Substance Use Topics  . Smoking status: Never Smoker   . Smokeless tobacco: Not on file  . Alcohol Use: Yes   OB History    Gravida Para Term Preterm AB TAB SAB Ectopic Multiple Living   3 1  1 1  1   1      Review of Systems Ten systems reviewed and are negative for acute change, except as noted in the HPI.    Allergies  Review of patient's allergies indicates no known allergies.  Home Medications   Prior to Admission medications   Medication Sig Start Date End Date Taking? Authorizing Provider   acetaminophen (TYLENOL) 500 MG tablet Take 1,000 mg by mouth every 6 (six) hours as needed (pain).    Historical Provider, MD  benzonatate (TESSALON) 100 MG capsule Take 1 capsule (100 mg total) by mouth every 8 (eight) hours. 05/27/14   Antony MaduraKelly Humes, PA-C  HYDROcodone-homatropine [facility](HYCODAN) 5-1.5 MG/5ML syrup Take 5 mLs by mouth every 6 (six) hours as needed for cough. 05/27/14   Antony MaduraKelly Humes, PA-C  naproxen (NAPROSYN) 500 MG tablet Take 1 tablet (500 mg total) by mouth 2 (two) times daily. 05/27/14   Antony MaduraKelly Humes, PA-C   BP 121/72 mmHg  Pulse 107  Temp(Src) 98.1 F (36.7 C) (Oral)  Resp 17  SpO2 98%  LMP 07/09/2014 Physical Exam  Constitutional: She is oriented to person, place, and time. She appears well-developed and well-nourished. No distress.  HENT:  Head: Normocephalic and atraumatic.  Eyes: Conjunctivae are normal. No scleral icterus.  Neck: Normal range of motion.  Cardiovascular: Normal rate, regular rhythm and normal heart sounds.  Exam reveals no gallop and no friction rub.   No murmur heard. Pulmonary/Chest: Effort normal and breath sounds normal. No respiratory distress.  Abdominal: Soft. Bowel sounds are normal. She exhibits no distension and no mass. There is no tenderness. There is no guarding.  Genitourinary:  Pelvic examination: Vital external female genitalia, vagina and cervix was multiparous os. Mucoid discharge from the cervix. Generalized pelvic pain without CMT, adnexal tenderness or fullness.  Neurological: She is alert and oriented to person, place, and time.  Skin: Skin is warm and dry. She is not diaphoretic.  Nursing note and vitals reviewed.   ED Course  Procedures (including critical care time) Labs Review Labs Reviewed  CBC WITH DIFFERENTIAL/PLATELET - Abnormal; Notable for the following:    Hemoglobin 11.4 (*)    HCT 34.6 (*)    All other components within normal limits  COMPREHENSIVE METABOLIC PANEL  URINALYSIS, ROUTINE W REFLEX MICROSCOPIC  HIV  ANTIBODY (ROUTINE TESTING)  RPR  I-STAT BETA HCG BLOOD, ED ([facility], [facility], [facility] ONLY)  I-STAT BETA HCG BLOOD, ED ([facility], [facility], [facility] ONLY)  GC/CHLAMYDIA PROBE AMP ([place])    Imaging Review No results found.   EKG Interpretation None      MDM   Final diagnoses:  Bilateral lower abdominal cramping   Filed Vitals:   08/06/14 1230 08/06/14 1245 08/06/14 1248 08/06/14 1249  BP: 114/73 120/85 120/85   Pulse: 67  88 60  Temp:      TempSrc:      Resp:   16   SpO2: 100%  100% 100%    Patient with history of "PCOS" however, her description sounds more like a history of rupturing ovarian cyst. Patient with benign pelvic examination. No tenderness on abdominal exam. I doubt any other emergent causes. Her labs appear reassuring. The patient will be discharged with Bentyl and naproxen. Follow up with OB/GYN. Safe for discharge at this time    Arthor Captain, PA-C 08/07/14 1139  Blake Divine, MD 08/07/14 [phone]

## 2014-08-06 NOTE — ED Notes (Signed)
Pt states she has pcod and reports lower abdomenal pain. Pt reports nausea as well.

## 2014-08-06 NOTE — Discharge Instructions (Signed)
Abdominal (belly) pain can be caused by many things. Your caregiver performed an examination and possibly ordered blood/urine tests and imaging (CT scan, x-rays, ultrasound). Many cases can be observed and treated at home after initial evaluation in the emergency department. Even though you are being discharged home, abdominal pain can be unpredictable. Therefore, you need a repeated exam if your pain does not resolve, returns, or worsens. Most patients with abdominal pain don't have to be admitted to the hospital or have surgery, but serious problems like appendicitis and gallbladder attacks can start out as nonspecific pain. Many abdominal conditions cannot be diagnosed in one visit, so follow-up evaluations are very important. °SEEK IMMEDIATE MEDICAL ATTENTION IF: °The pain does not go away or becomes severe.  °A temperature above 101 develops.  °Repeated vomiting occurs (multiple episodes).  °The pain becomes localized to portions of the abdomen. The right side could possibly be appendicitis. In an adult, the left lower portion of the abdomen could be colitis or diverticulitis.  °Blood is being passed in stools or vomit (bright red or black tarry stools).  °Return also if you develop chest pain, difficulty breathing, dizziness or fainting, or become confused, poorly responsive, or inconsolable (young children). ° ° °Abdominal Pain, Women °Abdominal (stomach, pelvic, or belly) pain can be caused by many things. It is important to tell your doctor: °· The location of the pain. °· Does it come and go or is it present all the time? °· Are there things that start the pain (eating certain foods, exercise)? °· Are there other symptoms associated with the pain (fever, nausea, vomiting, diarrhea)? °All of this is helpful to know when trying to find the cause of the pain. °CAUSES  °· Stomach: virus or bacteria infection, or ulcer. °· Intestine: appendicitis (inflamed appendix), regional ileitis (Crohn's disease),  ulcerative colitis (inflamed colon), irritable bowel syndrome, diverticulitis (inflamed diverticulum of the colon), or cancer of the stomach or intestine. °· Gallbladder disease or stones in the gallbladder. °· Kidney disease, kidney stones, or infection. °· Pancreas infection or cancer. °· Fibromyalgia (pain disorder). °· Diseases of the female organs: °¨ Uterus: fibroid (non-cancerous) tumors or infection. °¨ Fallopian tubes: infection or tubal pregnancy. °¨ Ovary: cysts or tumors. °¨ Pelvic adhesions (scar tissue). °¨ Endometriosis (uterus lining tissue growing in the pelvis and on the pelvic organs). °¨ Pelvic congestion syndrome (female organs filling up with blood just before the menstrual period). °¨ Pain with the menstrual period. °¨ Pain with ovulation (producing an egg). °¨ Pain with an IUD (intrauterine device, birth control) in the uterus. °¨ Cancer of the female organs. °· Functional pain (pain not caused by a disease, may improve without treatment). °· Psychological pain. °· Depression. °DIAGNOSIS  °Your doctor will decide the seriousness of your pain by doing an examination. °· Blood tests. °· X-rays. °· Ultrasound. °· CT scan (computed tomography, special type of X-ray). °· MRI (magnetic resonance imaging). °· Cultures, for infection. °· Barium enema (dye inserted in the large intestine, to better view it with X-rays). °· Colonoscopy (looking in intestine with a lighted tube). °· Laparoscopy (minor surgery, looking in abdomen with a lighted tube). °· Major abdominal exploratory surgery (looking in abdomen with a large incision). °TREATMENT  °The treatment will depend on the cause of the pain.  °· Many cases can be observed and treated at home. °· Over-the-counter medicines recommended by your caregiver. °· Prescription medicine. °· Antibiotics, for infection. °· Birth control pills, for painful periods or for ovulation pain. °·   Hormone treatment, for endometriosis. °· Nerve blocking  injections. °· Physical therapy. °· Antidepressants. °· Counseling with a psychologist or psychiatrist. °· Minor or major surgery. °HOME CARE INSTRUCTIONS  °· Do not take laxatives, unless directed by your caregiver. °· Take over-the-counter pain medicine only if ordered by your caregiver. Do not take aspirin because it can cause an upset stomach or bleeding. °· Try a clear liquid diet (broth or water) as ordered by your caregiver. Slowly move to a bland diet, as tolerated, if the pain is related to the stomach or intestine. °· Have a thermometer and take your temperature several times a day, and record it. °· Bed rest and sleep, if it helps the pain. °· Avoid sexual intercourse, if it causes pain. °· Avoid stressful situations. °· Keep your follow-up appointments and tests, as your caregiver orders. °· If the pain does not go away with medicine or surgery, you may try: °¨ Acupuncture. °¨ Relaxation exercises (yoga, meditation). °¨ Group therapy. °¨ Counseling. °SEEK MEDICAL CARE IF:  °· You notice certain foods cause stomach pain. °· Your home care treatment is not helping your pain. °· You need stronger pain medicine. °· You want your IUD removed. °· You feel faint or lightheaded. °· You develop nausea and vomiting. °· You develop a rash. °· You are having side effects or an allergy to your medicine. °SEEK IMMEDIATE MEDICAL CARE IF:  °· Your pain does not go away or gets worse. °· You have a fever. °· Your pain is felt only in portions of the abdomen. The right side could possibly be appendicitis. The left lower portion of the abdomen could be colitis or diverticulitis. °· You are passing blood in your stools (bright red or black tarry stools, with or without vomiting). °· You have blood in your urine. °· You develop chills, with or without a fever. °· You pass out. °MAKE SURE YOU:  °· Understand these instructions. °· Will watch your condition. °· Will get help right away if you are not doing well or get  worse. °Document Released: 01/20/2007 Document Revised: 08/09/2013 Document Reviewed: 02/09/2009 °ExitCare® Patient Information ©2015 [employer]. This information is not intended to replace advice given to you by your health care provider. Make sure you discuss any questions you have with your health care provider. ° °

## 2014-08-07 LAB — HIV ANTIBODY (ROUTINE TESTING W REFLEX): HIV Screen 4th Generation wRfx: NONREACTIVE

## 2014-08-07 LAB — RPR: RPR: NONREACTIVE

## 2014-08-08 LAB — GC/CHLAMYDIA PROBE AMP (~~LOC~~) NOT AT ARMC
CHLAMYDIA, DNA PROBE: NEGATIVE
Neisseria Gonorrhea: NEGATIVE

## 2014-09-24 ENCOUNTER — Emergency Department (HOSPITAL_COMMUNITY)
Admission: EM | Admit: 2014-09-24 | Discharge: 2014-09-24 | Disposition: A | Discharge status: Home / Self Care | Payer: Self-pay | Attending: Emergency Medicine | Admitting: Emergency Medicine

## 2014-09-24 ENCOUNTER — Encounter (HOSPITAL_COMMUNITY): Payer: Self-pay | Admitting: *Deleted

## 2014-09-24 ENCOUNTER — Emergency Department (HOSPITAL_COMMUNITY): Payer: PRIVATE HEALTH INSURANCE

## 2014-09-24 DIAGNOSIS — I10 Essential (primary) hypertension: Secondary | ICD-10-CM | POA: Insufficient documentation

## 2014-09-24 DIAGNOSIS — F419 Anxiety disorder, unspecified: Secondary | ICD-10-CM | POA: Insufficient documentation

## 2014-09-24 DIAGNOSIS — R0789 Other chest pain: Secondary | ICD-10-CM

## 2014-09-24 DIAGNOSIS — E119 Type 2 diabetes mellitus without complications: Secondary | ICD-10-CM | POA: Insufficient documentation

## 2014-09-24 DIAGNOSIS — J45901 Unspecified asthma with (acute) exacerbation: Secondary | ICD-10-CM | POA: Insufficient documentation

## 2014-09-24 DIAGNOSIS — M7989 Other specified soft tissue disorders: Secondary | ICD-10-CM | POA: Insufficient documentation

## 2014-09-24 LAB — I-STAT BETA HCG BLOOD, ED (MC, WL, AP ONLY): I-stat hCG, quantitative: <5 m[IU]/mL (ref <5)

## 2014-09-24 LAB — URINALYSIS, ROUTINE W REFLEX MICROSCOPIC
Bilirubin Urine: NEGATIVE
Glucose, UA: NEGATIVE mg/dL
Hgb urine dipstick: NEGATIVE
Ketones, ur: NEGATIVE mg/dL
LEUKOCYTES UA: NEGATIVE
NITRITE: NEGATIVE
PH: 7.5 (ref 5.0–8.0)
PROTEIN: NEGATIVE mg/dL
Specific Gravity, Urine: 1.011 (ref 1.005–1.030)
Urobilinogen, UA: 0.2 mg/dL (ref 0.0–1.0)

## 2014-09-24 LAB — I-STAT CHEM 8, ED
BUN: 14 mg/dL (ref 6–20)
Calcium, Ion: 1.16 mmol/L (ref 1.12–1.23)
Chloride: 104 mmol/L (ref 101–111)
Creatinine, Ser: 0.7 mg/dL (ref 0.44–1.00)
Glucose, Bld: 110 mg/dL — ABNORMAL HIGH (ref 65–99)
HEMATOCRIT: 40 % (ref 36.0–46.0)
HEMOGLOBIN: 13.6 g/dL (ref 12.0–15.0)
Potassium: 3.4 mmol/L — ABNORMAL LOW (ref 3.5–5.1)
SODIUM: 142 mmol/L (ref 135–145)
TCO2: 22 mmol/L (ref 0–100)

## 2014-09-24 LAB — D-DIMER, QUANTITATIVE: D-Dimer, Quant: 0.34 ug/mL-FEU (ref 0.00–0.48)

## 2014-09-24 MED ORDER — FENTANYL CITRATE (PF) 100 MCG/2ML IJ SOLN
100.0000 ug | Freq: Once | INTRAMUSCULAR | Status: AC
  Administered 2014-09-24: 100 ug via NASAL
  Filled 2014-09-24: qty 2

## 2014-09-24 MED ORDER — IBUPROFEN 800 MG PO TABS
800.0000 mg | ORAL_TABLET | Freq: Three times a day (TID) | ORAL | Status: DC | PRN
Start: 2014-09-24 — End: 2015-05-13

## 2014-09-24 NOTE — ED Provider Notes (Signed)
6:31 AM Patient signed out to me at end of shift by Tyler Deis, PA-C, at change of shift.  Pt with CP, SOB with negative workup.  UA pending.  Plan is for d/c home with ibuprofen, PCP follow up +/- antibiotics for UTI.    Discussed UA results with pt.  Pt sleeping and comfortable, no needs at this time.  D/C home as planned.    Trixie Dredge, PA-C 09/24/14 1110  Marisa Severin, MD 09/26/14 1356

## 2014-09-24 NOTE — ED Notes (Signed)
Pt verbalized understanding d/c instructions and has no further questions. Pt in no acute distress and is pain free upon d/c. Pt going home with significant other driving.

## 2014-09-24 NOTE — ED Provider Notes (Signed)
CSN: [id number]     Arrival date & time 09/24/14  0300 History   First MD Initiated Contact with Patient 09/24/14 [phone]     Chief Complaint  Patient presents with  . Anxiety     (Consider location/radiation/quality/duration/timing/severity/associated sxs/prior Treatment) HPI Comments: Patient is a 24 yo F PMHx significant for DM, HTN, Asthma presenting to the ED for SOB and CP that began this evening. She states she has had difficulty breathing in the past, but typically resolved with her inhaler. Tonight's symptoms felt different. She states she recently drove to and from [place] last week and has had some right leg swelling. No history of DVT or PE. No early familial cardiac history.   Patient is a 24 y.o. female presenting with anxiety.  Anxiety Associated symptoms include chest pain.    Past Medical History  Diagnosis Date  . Diabetes mellitus without complication   . Hypertension   . Asthma   . PCOS (polycystic ovarian syndrome)    Past Surgical History  Procedure Laterality Date  . Cholecystectomy     No family history on file. History  Substance Use Topics  . Smoking status: Never Smoker   . Smokeless tobacco: Not on file  . Alcohol Use: Yes   OB History    Gravida Para Term Preterm AB TAB SAB Ectopic Multiple Living   3 1  1 1  1   1      Review of Systems  Respiratory: Positive for shortness of breath.   Cardiovascular: Positive for chest pain and leg swelling.  All other systems reviewed and are negative.     Allergies  Review of patient's allergies indicates no known allergies.  Home Medications   Prior to Admission medications   Medication Sig Start Date End Date Taking? Authorizing Provider  benzonatate (TESSALON) 100 MG capsule Take 1 capsule (100 mg total) by mouth every 8 (eight) hours. Patient not taking: Reported on 09/24/2014 05/27/14   Antony Madura, PA-C  dicyclomine (BENTYL) 20 MG tablet Take 1 tablet (20 mg total) by mouth 2 (two) times  daily. Patient not taking: Reported on 09/24/2014 08/06/14   Arthor Captain, PA-C  HYDROcodone-homatropine [facility]) 5-1.5 MG/5ML syrup Take 5 mLs by mouth every 6 (six) hours as needed for cough. Patient not taking: Reported on 09/24/2014 05/27/14   Antony Madura, PA-C  naproxen (NAPROSYN) 500 MG tablet Take 1 tablet (500 mg total) by mouth 2 (two) times daily. Patient not taking: Reported on 09/24/2014 08/06/14   Arthor Captain, PA-C   BP 102/58 mmHg  Pulse 64  Temp(Src) 97.4 F (36.3 C)  Resp 18  SpO2 100%  LMP 09/10/2014  Breastfeeding? Unknown Physical Exam  Constitutional: She is oriented to person, place, and time. She appears well-developed and well-nourished. No distress.  HENT:  Head: Normocephalic and atraumatic.  Right Ear: External ear normal.  Left Ear: External ear normal.  Nose: Nose normal.  Mouth/Throat: Oropharynx is clear and moist.  Eyes: Conjunctivae are normal.  Neck: Normal range of motion. Neck supple.  No nuchal rigidity.   Cardiovascular: Normal rate, regular rhythm, normal heart sounds and intact distal pulses.   Pulmonary/Chest: Effort normal and breath sounds normal. She exhibits no tenderness.  Abdominal: Soft. There is no tenderness.  Musculoskeletal: Normal range of motion.  Right calf slightly larger than left calf  Neurological: She is alert and oriented to person, place, and time.  Skin: Skin is warm and dry. She is not diaphoretic.  Psychiatric: She has a  normal mood and affect.  Nursing note and vitals reviewed.   ED Course  Procedures (including critical care time) Medications  fentaNYL (SUBLIMAZE) injection 100 mcg (100 mcg Nasal Given 09/24/14 0527)    Labs Review Labs Reviewed  I-STAT CHEM 8, ED - Abnormal; Notable for the following:    Potassium 3.4 (*)    Glucose, Bld 110 (*)    All other components within normal limits  D-DIMER, QUANTITATIVE (NOT AT [facility])  URINALYSIS, ROUTINE W REFLEX MICROSCOPIC (NOT AT [facility])  I-STAT BETA HCG  BLOOD, ED ([facility], [facility], [facility] ONLY)    Imaging Review Dg Chest 2 View  09/24/2014   CLINICAL DATA:  Cough and shortness of breath today.  EXAM: CHEST  2 VIEW  COMPARISON:  05/27/2014  FINDINGS: The heart size and mediastinal contours are within normal limits. Both lungs are clear. The visualized skeletal structures are unremarkable.  IMPRESSION: No active cardiopulmonary disease.   Electronically Signed   By: Burman Nieves M.D.   On: 09/24/2014 03:34     EKG Interpretation None      MDM   Final diagnoses:  None    Filed Vitals:   09/24/14 0630  BP: 102/58  Pulse: 64  Temp:   Resp: 18    Afebrile, NAD, non-toxic appearing, AAOx4.   I have reviewed nursing notes, vital signs, and all appropriate lab and imaging results if ordered as above.  Patient is to be discharged with recommendation to follow up with PCP in regards to today's hospital visit. Chest pain is not likely of cardiac or pulmonary etiology d/t presentation, negative d-dimer low suspicion for PE as no hypoxia.  VSS, no tracheal deviation, no JVD or new murmur, RRR, breath sounds equal bilaterally, EKG without acute abnormalities,  and negative CXR. Still awaiting UA at shift change. Patient is symptom free. Signed out to [facility], PA-C pending re-eval. Patient d/w with Dr. Norlene Campbell, agrees with plan.      Francee Piccolo, PA-C 09/24/14 9604  Marisa Severin, MD 09/26/14 [phone]

## 2014-09-24 NOTE — ED Notes (Signed)
The pt started coughing earlier and she started having diffciulty breathing.  She has panic attacks.  lmp 2 weeks ago

## 2014-09-24 NOTE — Discharge Instructions (Signed)
Read the information below.  Use the prescribed medication as directed.  Please discuss all new medications with your pharmacist.  You may return to the Emergency Department at any time for worsening condition or any new symptoms that concern you.  If you develop worsening chest pain, shortness of breath, fever, you pass out, or become weak or dizzy, return to the ER for a recheck.      Chest Pain (Nonspecific) It is often hard to give a diagnosis for the cause of chest pain. There is always a chance that your pain could be related to something serious, such as a heart attack or a blood clot in the lungs. You need to follow up with your doctor. HOME CARE  If antibiotic medicine was given, take it as directed by your doctor. Finish the medicine even if you start to feel better.  For the next few days, avoid activities that bring on chest pain. Continue physical activities as told by your doctor.  Do not use any tobacco products. This includes cigarettes, chewing tobacco, and e-cigarettes.  Avoid drinking alcohol.  Only take medicine as told by your doctor.  Follow your doctor's suggestions for more testing if your chest pain does not go away.  Keep all doctor visits you made. GET HELP IF:  Your chest pain does not go away, even after treatment.  You have a rash with blisters on your chest.  You have a fever. GET HELP RIGHT AWAY IF:   You have more pain or pain that spreads to your arm, neck, jaw, back, or belly (abdomen).  You have shortness of breath.  You cough more than usual or cough up blood.  You have very bad back or belly pain.  You feel sick to your stomach (nauseous) or throw up (vomit).  You have very bad weakness.  You pass out (faint).  You have chills. This is an emergency. Do not wait to see if the problems will go away. Call your local emergency services (911 in [place] Do not drive yourself to the hospital. MAKE SURE YOU:   Understand these  instructions.  Will watch your condition.  Will get help right away if you are not doing well or get worse. Document Released: 09/11/2007 Document Revised: 03/30/2013 Document Reviewed: 09/11/2007 [facility] Patient Information 2015 [place], [place]. This information is not intended to replace advice given to you by your health care provider. Make sure you discuss any questions you have with your health care provider.

## 2014-10-08 ENCOUNTER — Encounter (HOSPITAL_COMMUNITY): Payer: Self-pay | Admitting: *Deleted

## 2014-10-08 ENCOUNTER — Emergency Department (HOSPITAL_COMMUNITY)
Admission: EM | Admit: 2014-10-08 | Discharge: 2014-10-08 | Discharge status: Against Medical Advice | Payer: Self-pay | Attending: Emergency Medicine | Admitting: Emergency Medicine

## 2014-10-08 DIAGNOSIS — E119 Type 2 diabetes mellitus without complications: Secondary | ICD-10-CM | POA: Insufficient documentation

## 2014-10-08 DIAGNOSIS — I10 Essential (primary) hypertension: Secondary | ICD-10-CM | POA: Insufficient documentation

## 2014-10-08 DIAGNOSIS — J45909 Unspecified asthma, uncomplicated: Secondary | ICD-10-CM | POA: Insufficient documentation

## 2014-10-08 DIAGNOSIS — R51 Headache: Secondary | ICD-10-CM | POA: Insufficient documentation

## 2014-10-08 NOTE — ED Notes (Signed)
The pt is c/o a migraine headache for 2-3 days no n v or diarrhea.  She is also c/o lt chest pain since 1400 today.  She has been seen here for the chest pain not cardiac.  They were unable to find a reason for the pain.  The pain is worse with movement.  She came in with a large drink and a large plate of food.  On her cell while being triaged until i asked her to  Turn it off.  She was here 2 weeks ago  Still has the adhesive on her chest from the electrodes

## 2014-10-08 NOTE — ED Notes (Signed)
Pt is leaving. States "I made an appt with my dr back at home in [place] and I am going to see them."

## 2015-01-19 ENCOUNTER — Encounter (HOSPITAL_COMMUNITY): Payer: Self-pay | Admitting: Emergency Medicine

## 2015-01-19 ENCOUNTER — Emergency Department (HOSPITAL_COMMUNITY)
Admission: EM | Admit: 2015-01-19 | Discharge: 2015-01-19 | Disposition: A | Discharge status: Home / Self Care | Payer: Self-pay | Attending: Emergency Medicine | Admitting: Emergency Medicine

## 2015-01-19 DIAGNOSIS — J45909 Unspecified asthma, uncomplicated: Secondary | ICD-10-CM | POA: Insufficient documentation

## 2015-01-19 DIAGNOSIS — I10 Essential (primary) hypertension: Secondary | ICD-10-CM | POA: Insufficient documentation

## 2015-01-19 DIAGNOSIS — M67431 Ganglion, right wrist: Secondary | ICD-10-CM | POA: Insufficient documentation

## 2015-01-19 DIAGNOSIS — E119 Type 2 diabetes mellitus without complications: Secondary | ICD-10-CM | POA: Insufficient documentation

## 2015-01-19 DIAGNOSIS — Z720 Tobacco use: Secondary | ICD-10-CM | POA: Insufficient documentation

## 2015-01-19 NOTE — ED Notes (Signed)
History of a right hand cyst and recent developed increasing pain and swelling radiating to right upper arm. Pain currently 8/10 achy sharp. Radial pulse +2 full with slight numbness to 3rd finger.

## 2015-01-19 NOTE — ED Provider Notes (Signed)
CSN: [id number]     Arrival date & time 01/19/15  1240 History  By signing my name below, I, Tanda RockersMargaux Venter, attest that this documentation has been prepared under the direction and in the presence of Roxy Horsemanobert Destynee Stringfellow, PA-C. Electronically Signed: Tanda RockersMargaux Venter, ED Scribe. 01/19/2015. 1:45 PM.  Chief Complaint  Patient presents with  . Hand Pain   The history is provided by the patient. No language interpreter was used.     HPI Comments: Brittany Dalton is a 24 y.o. female with hx DM who presents to the Emergency Department complaining of gradual onset, constant, aching, 8/10, right hand pain radiating to right shoulder x a couple of days. Pt has had a cyst on the right hand for the past 3 years and states it just recently begun hurting. She also complains of increased swelling to the area. No recent injury, trauma, or fall. She denies weakness, numbness, tingling, or any other associated symptoms.    Past Medical History  Diagnosis Date  . Diabetes mellitus without complication (HCC)   . Hypertension   . Asthma   . PCOS (polycystic ovarian syndrome)    Past Surgical History  Procedure Laterality Date  . Cholecystectomy     No family history on file. Social History  Substance Use Topics  . Smoking status: Current Every Day Smoker  . Smokeless tobacco: None  . Alcohol Use: Yes   OB History    Gravida Para Term Preterm AB TAB SAB Ectopic Multiple Living   3 1  1 1  1   1      Review of Systems  Musculoskeletal: Positive for joint swelling and arthralgias (Right hand radiating up to right shoulder).  Skin: Negative for wound.  Neurological: Negative for weakness and numbness.  All other systems reviewed and are negative.  Allergies  Review of patient's allergies indicates no known allergies.  Home Medications   Prior to Admission medications   Medication Sig Start Date End Date Taking? Authorizing Provider  benzonatate (TESSALON) 100 MG capsule Take 1 capsule (100 mg  total) by mouth every 8 (eight) hours. Patient not taking: Reported on 09/24/2014 05/27/14   Antony MaduraKelly Humes, PA-C  dicyclomine (BENTYL) 20 MG tablet Take 1 tablet (20 mg total) by mouth 2 (two) times daily. Patient not taking: Reported on 09/24/2014 08/06/14   Arthor CaptainAbigail Harris, PA-C  HYDROcodone-homatropine [facility](HYCODAN) 5-1.5 MG/5ML syrup Take 5 mLs by mouth every 6 (six) hours as needed for cough. Patient not taking: Reported on 09/24/2014 05/27/14   Antony MaduraKelly Humes, PA-C  ibuprofen (ADVIL,MOTRIN) 800 MG tablet Take 1 tablet (800 mg total) by mouth every 8 (eight) hours as needed for mild pain or moderate pain. 09/24/14   Trixie DredgeEmily West, PA-C   Triage Vitals: BP 123/83 mmHg  Pulse 90  Temp(Src) 98.5 F (36.9 C) (Oral)  Resp 16  Ht 5\' 6"  (1.676 m)  Wt 176 lb (79.833 kg)  BMI 28.42 kg/m2  SpO2 97%   Physical Exam  Constitutional: She is oriented to person, place, and time. She appears well-developed and well-nourished. No distress.  HENT:  Head: Normocephalic and atraumatic.  Eyes: Conjunctivae and EOM are normal.  Neck: Neck supple. No tracheal deviation present.  Cardiovascular: Normal rate and intact distal pulses.   Pulmonary/Chest: Effort normal. No respiratory distress.  Musculoskeletal: Normal range of motion.  Right wrist cyst, no erythema, abscess, ROM and strength 5/5  Neurological: She is alert and oriented to person, place, and time.  Skin: Skin is warm and dry.  Psychiatric: She has a normal mood and affect. Her behavior is normal.  Nursing note and vitals reviewed.   ED Course  Procedures (including critical care time)  DIAGNOSTIC STUDIES: Oxygen Saturation is 97% on RA, normal by my interpretation.    COORDINATION OF CARE: 1:39 PM-Discussed treatment plan which includes OTC pain medication, ice, and following up with hand specialist with pt at bedside and pt agreed to plan.     MDM   Final diagnoses:  Ganglion cyst of wrist, right   Patient with cyst of right wrist.   Intermittent x 3 years.  Likely ganglion cyst.  Recommend RICE and hand follow-up. No indication for imaging, no MOI.  I, Escarlet Saathoff, personally performed the services described in this documentation. All medical record entries made by the scribe were at my direction and in my presence.  I have reviewed the chart and discharge instructions and agree that the record reflects my personal performance and is accurate and complete. Askari Kinley.  01/19/2015. 2:10 PM.        Roxy Horseman, PA-C 01/19/15 1410  Elwin Mocha, MD 01/19/15 1544

## 2015-01-19 NOTE — Discharge Instructions (Signed)
Ganglion Cyst  A ganglion cyst is a noncancerous, fluid-filled lump that occurs near joints or tendons. The ganglion cyst grows out of a joint or the lining of a tendon. It most often develops in the hand or wrist, but it can also develop in the shoulder, elbow, hip, knee, ankle, or foot. The round or oval ganglion cyst can be the size of a pea or larger than a grape. Increased activity may enlarge the size of the cyst because more fluid starts to build up.   CAUSES  It is not known what causes a ganglion cyst to grow. However, it may be related to:  · Inflammation or irritation around the joint.  · An injury.  · Repetitive movements or overuse.  · Arthritis.  RISK FACTORS  Risk factors include:  · Being a woman.  · Being age 20-50.  SIGNS AND SYMPTOMS  Symptoms may include:   · A lump. This most often appears on the hand or wrist, but it can occur in other areas of the body.  · Tingling.  · Pain.  · Numbness.  · Muscle weakness.  · Weak grip.  · Less movement in a joint.  DIAGNOSIS  Ganglion cysts are most often diagnosed based on a physical exam. Your health care provider will feel the lump and may shine a light alongside it. If it is a ganglion cyst, a light often shines through it. Your health care provider may order an X-ray, ultrasound, or MRI to rule out other conditions.  TREATMENT  Ganglion cysts usually go away on their own without treatment. If pain or other symptoms are involved, treatment may be needed. Treatment is also needed if the ganglion cyst limits your movement or if it gets infected. Treatment may include:  · Wearing a brace or splint on your wrist or finger.  · Taking anti-inflammatory medicine.  · Draining fluid from the lump with a needle (aspiration).  · Injecting a steroid into the joint.  · Surgery to remove the ganglion cyst.  HOME CARE INSTRUCTIONS  · Do not press on the ganglion cyst, poke it with a needle, or hit it.  · Take medicines only as directed by your health care  provider.  · Wear your brace or splint as directed by your health care provider.  · Watch your ganglion cyst for any changes.  · Keep all follow-up visits as directed by your health care provider. This is important.  SEEK MEDICAL CARE IF:  · Your ganglion cyst becomes larger or more painful.  · You have increased redness, red streaks, or swelling.  · You have pus coming from the lump.  · You have weakness or numbness in the affected area.  · You have a fever or chills.     This information is not intended to replace advice given to you by your health care provider. Make sure you discuss any questions you have with your health care provider.     Document Released: 03/22/2000 Document Revised: 04/15/2014 Document Reviewed: 09/07/2013  Elsevier Interactive Patient Education ©2016 [employer]

## 2015-05-13 ENCOUNTER — Emergency Department (HOSPITAL_COMMUNITY): Payer: Medicaid Other

## 2015-05-13 ENCOUNTER — Emergency Department (HOSPITAL_COMMUNITY)
Admission: EM | Admit: 2015-05-13 | Discharge: 2015-05-13 | Disposition: A | Discharge status: Home / Self Care | Payer: Medicaid Other | Attending: Emergency Medicine | Admitting: Emergency Medicine

## 2015-05-13 ENCOUNTER — Encounter (HOSPITAL_COMMUNITY): Payer: Self-pay

## 2015-05-13 DIAGNOSIS — F17200 Nicotine dependence, unspecified, uncomplicated: Secondary | ICD-10-CM | POA: Diagnosis not present

## 2015-05-13 DIAGNOSIS — I10 Essential (primary) hypertension: Secondary | ICD-10-CM | POA: Diagnosis not present

## 2015-05-13 DIAGNOSIS — R110 Nausea: Secondary | ICD-10-CM | POA: Diagnosis not present

## 2015-05-13 DIAGNOSIS — J45901 Unspecified asthma with (acute) exacerbation: Secondary | ICD-10-CM | POA: Insufficient documentation

## 2015-05-13 DIAGNOSIS — E119 Type 2 diabetes mellitus without complications: Secondary | ICD-10-CM | POA: Diagnosis not present

## 2015-05-13 DIAGNOSIS — B349 Viral infection, unspecified: Secondary | ICD-10-CM | POA: Diagnosis not present

## 2015-05-13 DIAGNOSIS — R05 Cough: Secondary | ICD-10-CM | POA: Diagnosis present

## 2015-05-13 MED ORDER — MECLIZINE HCL 25 MG PO TABS
25.0000 mg | ORAL_TABLET | Freq: Once | ORAL | Status: AC
  Administered 2015-05-13: 25 mg via ORAL
  Filled 2015-05-13: qty 1

## 2015-05-13 MED ORDER — BENZONATATE 100 MG PO CAPS: 100.0000 mg | ORAL_CAPSULE | Freq: Three times a day (TID) | ORAL | Status: AC

## 2015-05-13 MED ORDER — ONDANSETRON 4 MG PO TBDP
4.0000 mg | ORAL_TABLET | Freq: Once | ORAL | Status: AC
  Administered 2015-05-13: 4 mg via ORAL
  Filled 2015-05-13: qty 1

## 2015-05-13 MED ORDER — NAPROXEN 500 MG PO TABS: 500.0000 mg | ORAL_TABLET | Freq: Two times a day (BID) | ORAL | Status: AC

## 2015-05-13 MED ORDER — ONDANSETRON 4 MG PO TBDP: 4.0000 mg | ORAL_TABLET | Freq: Three times a day (TID) | ORAL | Status: AC | PRN

## 2015-05-13 MED ORDER — MECLIZINE HCL 25 MG PO TABS: 25.0000 mg | ORAL_TABLET | Freq: Three times a day (TID) | ORAL | Status: AC | PRN

## 2015-05-13 NOTE — Discharge Instructions (Signed)

## 2015-05-13 NOTE — ED Notes (Signed)
Pt c/o cold symptoms x 2 days.  Productive (greenish) cough, body aches, sore throat, headache, probable fevers.

## 2015-05-13 NOTE — ED Provider Notes (Signed)
CSN: [id number]     Arrival date & time 05/13/15  0911 History   First MD Initiated Contact with Patient 05/13/15 [phone]     Chief Complaint  Patient presents with  . Cough      HPI  Patient presents for evaluation of a cough. She's had symptoms for 2 days. States "all my muscles". Felt cold and hot at home. Occasional shakes and chills. Was not immunized for influenza this year. A cough occasionally productive sputum. No sinus pain or pressure. Mild sore throat. Presents to this morning stating that she feels dizzy like things are spinning when she stands and has mild nausea.  Past Medical History  Diagnosis Date  . Diabetes mellitus without complication (HCC)   . Hypertension   . Asthma   . PCOS (polycystic ovarian syndrome)    Past Surgical History  Procedure Laterality Date  . Cholecystectomy     No family history on file. Social History  Substance Use Topics  . Smoking status: Current Some Day Smoker  . Smokeless tobacco: Not on file  . Alcohol Use: No   OB History    Gravida Para Term Preterm AB TAB SAB Ectopic Multiple Living   Review of Systems  Constitutional: Positive for fever and chills. Negative for diaphoresis, appetite change and fatigue.  HENT: Negative for mouth sores, sore throat and trouble swallowing.   Eyes: Negative for visual disturbance.  Respiratory: Positive for cough and shortness of breath. Negative for chest tightness and wheezing.   Cardiovascular: Negative for chest pain.  Gastrointestinal: Positive for nausea. Negative for vomiting, abdominal pain, diarrhea and abdominal distention.  Endocrine: Negative for polydipsia, polyphagia and polyuria.  Genitourinary: Negative for dysuria, frequency and hematuria.  Musculoskeletal: Negative for gait problem.  Skin: Negative for color change, pallor and rash.  Neurological: Negative for dizziness, syncope, light-headedness and headaches.  Hematological: Does not bruise/bleed  easily.  Psychiatric/Behavioral: Negative for behavioral problems and confusion.      Allergies  Review of patient's allergies indicates no known allergies.  Home Medications   Prior to Admission medications   Medication Sig Start Date End Date Taking? Authorizing Provider  benzonatate (TESSALON) 100 MG capsule Take 1 capsule (100 mg total) by mouth every 8 (eight) hours. 05/13/15   Rolland Porter, MD  meclizine (ANTIVERT) 25 MG tablet Take 1 tablet (25 mg total) by mouth 3 (three) times daily as needed. 05/13/15   Rolland Porter, MD  naproxen (NAPROSYN) 500 MG tablet Take 1 tablet (500 mg total) by mouth 2 (two) times daily. 05/13/15   Rolland Porter, MD  ondansetron (ZOFRAN ODT) 4 MG disintegrating tablet Take 1 tablet (4 mg total) by mouth every 8 (eight) hours as needed for nausea. 05/13/15   Rolland Porter, MD   BP 122/75 mmHg  Pulse 76  Temp(Src) 98.2 F (36.8 C) (Oral)  Resp 16  SpO2 100%  LMP 05/13/2015 Physical Exam  Constitutional: She is oriented to person, place, and time. She appears well-developed and well-nourished. No distress.  Recheck oral temperature 98.8.  HENT:  Head: Normocephalic.  Mouth/Throat:    Eyes: Conjunctivae are normal. Pupils are equal, round, and reactive to light. No scleral icterus.  Neck: Normal range of motion. Neck supple. No thyromegaly present.  Cardiovascular: Normal rate and regular rhythm.  Exam reveals no gallop and no friction rub.   No murmur heard. Pulmonary/Chest: Effort normal and breath sounds normal. No respiratory  distress. She has no wheezes. She has no rales.  Frequent cough. Clear bilateral breath sounds.  Abdominal: Soft. Bowel sounds are normal. She exhibits no distension. There is no tenderness. There is no rebound.  Musculoskeletal: Normal range of motion.  Neurological: She is alert and oriented to person, place, and time.  Skin: Skin is warm and dry. No rash noted.  Psychiatric: She has a normal mood and affect. Her behavior is normal.      ED Course  Procedures (including critical care time) Labs Review Labs Reviewed - No data to display  Imaging Review Dg Chest 2 View  05/13/2015  CLINICAL DATA:  Acute chest pain and shortness of breath. EXAM: CHEST  2 VIEW COMPARISON:  September 24, 2014. FINDINGS: The heart size and mediastinal contours are within normal limits. Both lungs are clear. No pneumothorax or pleural effusion is noted. The visualized skeletal structures are unremarkable. IMPRESSION: No active cardiopulmonary disease. Electronically Signed   By: Lupita Raider, M.D.   On: 05/13/2015 10:48   I have personally reviewed and evaluated these images and lab results as part of my medical decision-making.   EKG Interpretation None      MDM   Final diagnoses:  Viral syndrome        Rolland Porter, MD 05/14/15 [phone]

## 2016-06-21 IMAGING — DX DG CERVICAL SPINE 2 OR 3 VIEWS
3 series · 3 of 3 positions shown · non-contrast
Comparison: None.

CLINICAL DATA: Left neck strain, pain

EXAM:
CERVICAL SPINE  4+ VIEWS

[c-spine lat]
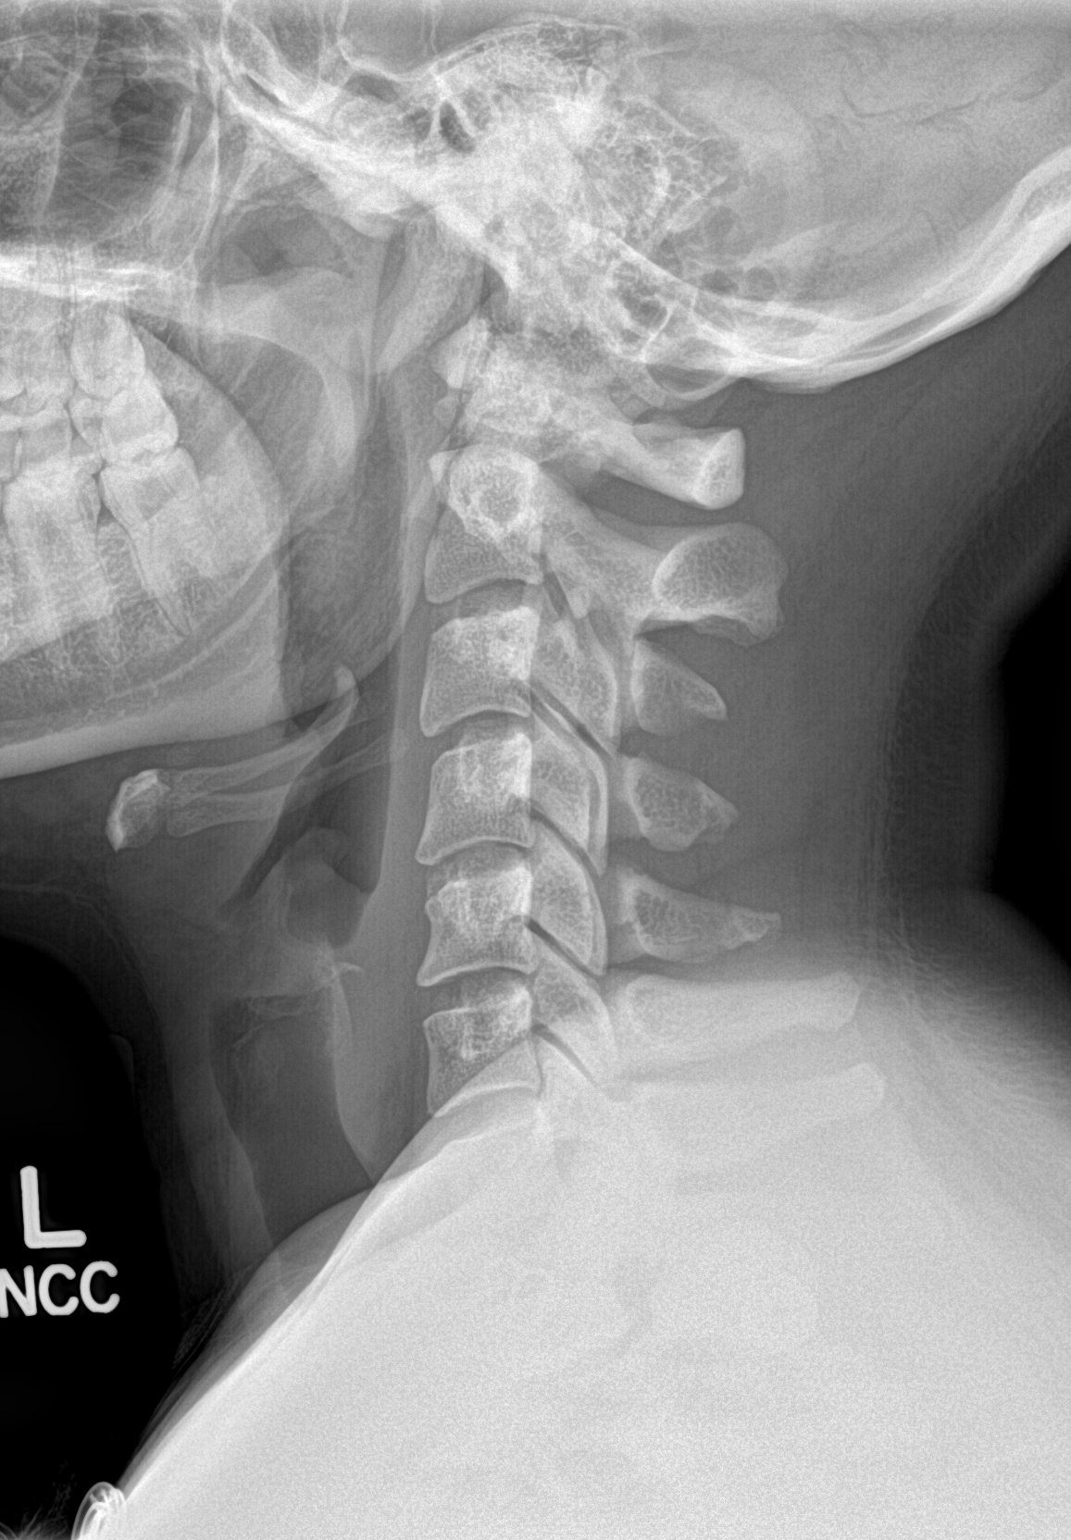

[c-spine ap]
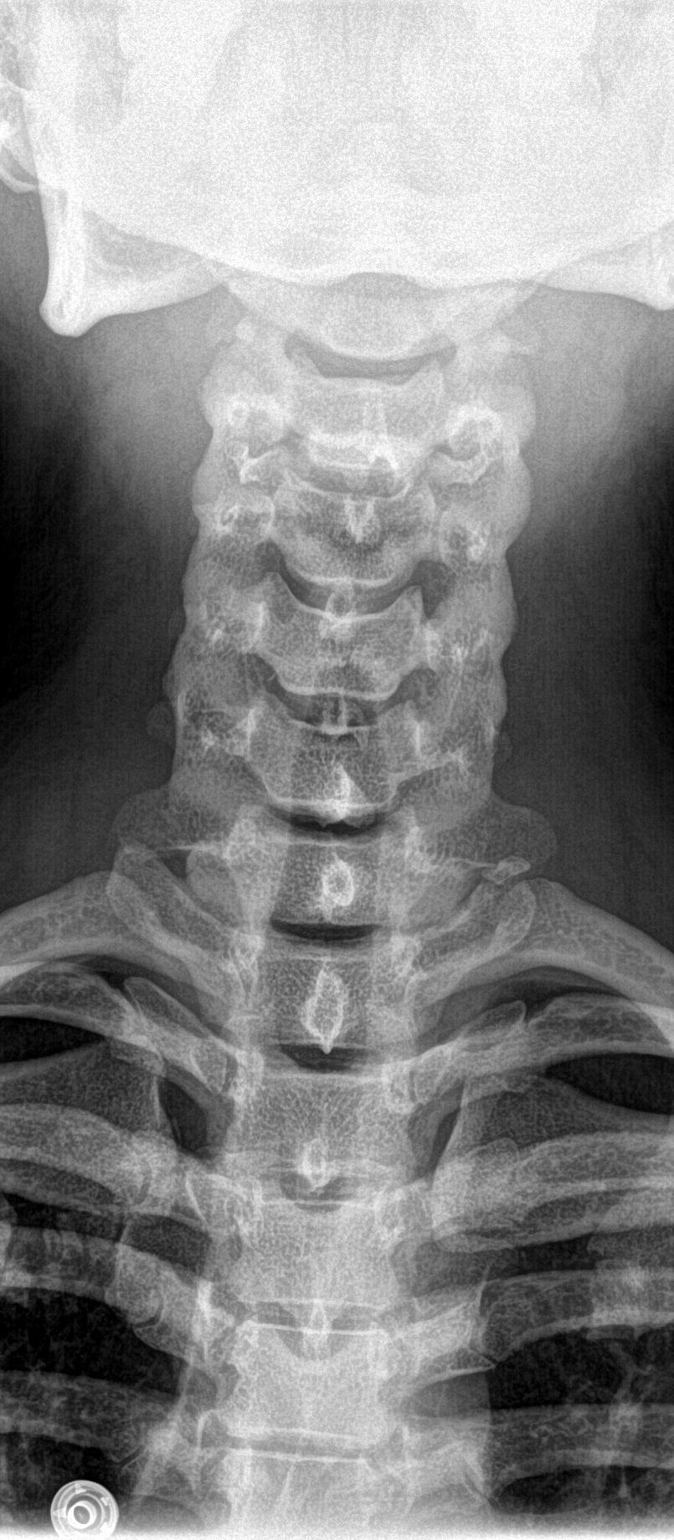

[c-spine open mouth]
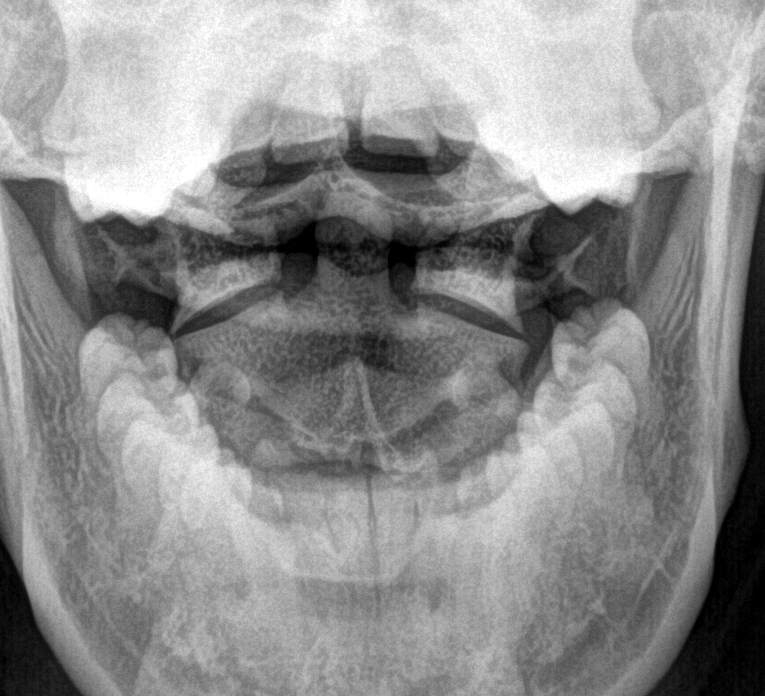

[3 of 3 positions shown; findings below may reference images not displayed]

FINDINGS: There is no evidence of cervical spine fracture or prevertebral soft
tissue swelling. Alignment is normal. No other significant bone
abnormalities are identified.
IMPRESSION: Negative cervical spine radiographs.
